# Patient Record
Sex: Female | Born: 1940 | Race: Black or African American | Hispanic: No | Marital: Married | Smoking: Never smoker
Health system: Southern US, Community
[De-identification: ages and names within clinical notes are randomized; demographics above are authoritative.]

## PROBLEM LIST (undated history)

## (undated) DIAGNOSIS — E669 Obesity, unspecified: Secondary | ICD-10-CM

## (undated) DIAGNOSIS — I10 Essential (primary) hypertension: Secondary | ICD-10-CM

## (undated) DIAGNOSIS — R609 Edema, unspecified: Secondary | ICD-10-CM

## (undated) DIAGNOSIS — K5790 Diverticulosis of intestine, part unspecified, without perforation or abscess without bleeding: Secondary | ICD-10-CM

## (undated) DIAGNOSIS — E785 Hyperlipidemia, unspecified: Secondary | ICD-10-CM

## (undated) DIAGNOSIS — M1990 Unspecified osteoarthritis, unspecified site: Secondary | ICD-10-CM

## (undated) DIAGNOSIS — K439 Ventral hernia without obstruction or gangrene: Secondary | ICD-10-CM

## (undated) DIAGNOSIS — G4733 Obstructive sleep apnea (adult) (pediatric): Secondary | ICD-10-CM

## (undated) DIAGNOSIS — M25552 Pain in left hip: Secondary | ICD-10-CM

## (undated) HISTORY — DX: Hyperlipidemia, unspecified: E78.5

## (undated) HISTORY — DX: Obesity, unspecified: E66.9

## (undated) HISTORY — DX: Ventral hernia without obstruction or gangrene: K43.9

## (undated) HISTORY — DX: Pain in left hip: M25.552

## (undated) HISTORY — DX: Obstructive sleep apnea (adult) (pediatric): G47.33

## (undated) HISTORY — DX: Diverticulosis of intestine, part unspecified, without perforation or abscess without bleeding: K57.90

## (undated) HISTORY — DX: Unspecified osteoarthritis, unspecified site: M19.90

## (undated) HISTORY — DX: Essential (primary) hypertension: I10

---

## 2011-06-23 ENCOUNTER — Other Ambulatory Visit: Payer: Self-pay | Admitting: Obstetrics and Gynecology

## 2011-06-23 DIAGNOSIS — Z1231 Encounter for screening mammogram for malignant neoplasm of breast: Secondary | ICD-10-CM

## 2011-08-04 ENCOUNTER — Ambulatory Visit
Admission: RE | Admit: 2011-08-04 | Discharge: 2011-08-04 | Disposition: A | Discharge status: Home / Self Care | Payer: Medicare Other | Source: Ambulatory Visit | Attending: Obstetrics and Gynecology | Admitting: Obstetrics and Gynecology

## 2011-08-04 DIAGNOSIS — Z1231 Encounter for screening mammogram for malignant neoplasm of breast: Secondary | ICD-10-CM

## 2011-08-07 ENCOUNTER — Other Ambulatory Visit: Payer: Self-pay | Admitting: Obstetrics and Gynecology

## 2011-08-07 DIAGNOSIS — R928 Other abnormal and inconclusive findings on diagnostic imaging of breast: Secondary | ICD-10-CM

## 2011-08-17 ENCOUNTER — Encounter: Payer: Self-pay | Admitting: Internal Medicine

## 2011-08-20 ENCOUNTER — Ambulatory Visit
Admission: RE | Admit: 2011-08-20 | Discharge: 2011-08-20 | Disposition: A | Discharge status: Home / Self Care | Payer: Medicare Other | Source: Ambulatory Visit | Attending: Obstetrics and Gynecology | Admitting: Obstetrics and Gynecology

## 2011-08-20 DIAGNOSIS — R928 Other abnormal and inconclusive findings on diagnostic imaging of breast: Secondary | ICD-10-CM

## 2011-09-01 ENCOUNTER — Ambulatory Visit (AMBULATORY_SURGERY_CENTER): Payer: Medicare Other

## 2011-09-01 VITALS — Ht 66.0 in | Wt 260.0 lb

## 2011-09-01 DIAGNOSIS — Z1211 Encounter for screening for malignant neoplasm of colon: Secondary | ICD-10-CM

## 2011-09-01 MED ORDER — PEG-KCL-NACL-NASULF-NA ASC-C 100 G PO SOLR: 1.0000 | Freq: Once | ORAL | Status: AC

## 2011-09-07 ENCOUNTER — Telehealth: Payer: Self-pay | Admitting: Internal Medicine

## 2011-09-07 NOTE — Telephone Encounter (Signed)
Rx for Moviprep called in to [facility].  Left message on pt's answering machine. Krystal Gutierrez

## 2011-09-15 ENCOUNTER — Encounter: Payer: Self-pay | Admitting: Internal Medicine

## 2011-09-15 ENCOUNTER — Telehealth: Payer: Self-pay | Admitting: *Deleted

## 2011-09-15 ENCOUNTER — Ambulatory Visit (AMBULATORY_SURGERY_CENTER): Payer: Medicare Other | Admitting: Internal Medicine

## 2011-09-15 ENCOUNTER — Other Ambulatory Visit (INDEPENDENT_AMBULATORY_CARE_PROVIDER_SITE_OTHER): Payer: Medicare Other

## 2011-09-15 VITALS — BP 117/55 | HR 72 | Temp 97.2°F | Resp 20 | Ht 66.0 in | Wt 260.0 lb

## 2011-09-15 DIAGNOSIS — R6889 Other general symptoms and signs: Secondary | ICD-10-CM

## 2011-09-15 DIAGNOSIS — K56609 Unspecified intestinal obstruction, unspecified as to partial versus complete obstruction: Secondary | ICD-10-CM

## 2011-09-15 DIAGNOSIS — Z1211 Encounter for screening for malignant neoplasm of colon: Secondary | ICD-10-CM

## 2011-09-15 DIAGNOSIS — R1900 Intra-abdominal and pelvic swelling, mass and lump, unspecified site: Secondary | ICD-10-CM

## 2011-09-15 DIAGNOSIS — R109 Unspecified abdominal pain: Secondary | ICD-10-CM

## 2011-09-15 LAB — COMPREHENSIVE METABOLIC PANEL
ALT: 13 U/L (ref 0–35)
Albumin: 4.3 g/dL (ref 3.5–5.2)
CO2: 23 mEq/L (ref 19–32)
Calcium: 9.6 mg/dL (ref 8.4–10.5)
Chloride: 104 mEq/L (ref 96–112)
GFR: 126.25 mL/min (ref >60.00)
Glucose, Bld: 86 mg/dL (ref 70–99)
Potassium: 3.7 mEq/L (ref 3.5–5.1)
Sodium: 139 mEq/L (ref 135–145)
Total Protein: 8 g/dL (ref 6.0–8.3)

## 2011-09-15 LAB — CBC WITH DIFFERENTIAL/PLATELET
Basophils Absolute: 0 10*3/uL (ref 0.0–0.1)
Eosinophils Relative: 2.7 % (ref 0.0–5.0)
HCT: 42.5 % (ref 36.0–46.0)
Lymphocytes Relative: 18.6 % (ref 12.0–46.0)
Lymphs Abs: 1.4 10*3/uL (ref 0.7–4.0)
Monocytes Relative: 5 % (ref 3.0–12.0)
Platelets: 258 10*3/uL (ref 150.0–400.0)
RDW: 16.5 % — ABNORMAL HIGH (ref 11.5–14.6)
WBC: 7.6 10*3/uL (ref 4.5–10.5)

## 2011-09-15 LAB — TSH: TSH: 1.33 u[IU]/mL (ref 0.35–5.50)

## 2011-09-15 LAB — PROTIME-INR: INR: 1.1 ratio — ABNORMAL HIGH (ref 0.8–1.0)

## 2011-09-15 MED ORDER — SODIUM CHLORIDE 0.9 % IV SOLN: 500.0000 mL | INTRAVENOUS | Status: DC

## 2011-09-15 NOTE — Telephone Encounter (Signed)
Patient will need labs:CBC, cmet, TSH, PT, PTT and CT abd/pelvis for incomplete colonoscopy:colon obstruction hernia vs abdominal mass per Dr. Juanda Chance. Labs in EPIC. CT scheduled with Rose at [address] on 09/22/11  9:45/10:00 AM. Contrast and instructions given to patient by [facility].

## 2011-09-15 NOTE — Op Note (Signed)
[facility] [address] [place], [place]  [address]  COLONOSCOPY PROCEDURE REPORT  PATIENT:  Krystal, Gutierrez  MR#:  [id number] BIRTHDATE:  03/26/1941, 70 yrs. old  GENDER:  female ENDOSCOPIST:  Hedwig Morton. Juanda Chance, MD REF. BY: PROCEDURE DATE:  09/15/2011 PROCEDURE:  Incomplete colonoscopy ASA CLASS:  Class II INDICATIONS:  colorectal cancer screening, average risk MEDICATIONS:   MAC sedation, administered by CRNA, propofol (Diprivan) 150 mg  DESCRIPTION OF PROCEDURE:   After the risks and benefits and of the procedure were explained, informed consent was obtained. Digital rectal exam was performed and revealed no rectal masses. The [id number] [id number] endoscope was introduced through the anus and advanced to the splenic flexure.  The quality of the prep was good, using MoviPrep.  The instrument was then slowly withdrawn as the colon was fully examined. <<PROCEDUREIMAGES>>  FINDINGS:  incomplete exam (see image4). at 70 cm, lumen closed, scope felt to be in a periumbilicam henia/mass, palpated through the abdome, scope could not pass,  Mild diverticulosis was found in the sigmoid colon (see image3, image2, and image1).  normal rectum (see image5).   Retroflexed views in the rectum revealed no abnormalities.    The scope was then withdrawn from the patient and the procedure completed.  COMPLICATIONS:  None ENDOSCOPIC IMPRESSION: 1) Incomplete exam 2) Mild diverticulosis in the sigmoid colon 3) Normal rectum suspect scope coiled in a umbilical hernia/mass around 70 cm, unable to pass the scope RECOMMENDATIONS: CT scan of the abdomen to look for a mass, then consider BE  REPEAT EXAM:  In 0 year(s) for.  ______________________________ Hedwig Morton. Juanda Chance, MD  CC:  n. eSIGNED:   Hedwig Morton. Krystal Gutierrez at 09/15/2011 11:39 AM  [name], [id number]

## 2011-09-15 NOTE — Patient Instructions (Addendum)
YOU HAD AN ENDOSCOPIC PROCEDURE TODAY AT THE [facility]: Refer to the procedure report that was given to you for any specific questions about what was found during the examination.  If the procedure report does not answer your questions, please call your gastroenterologist to clarify.  If you requested that your care partner not be given the details of your procedure findings, then the procedure report has been included in a sealed envelope for you to review at your convenience later.  YOU SHOULD EXPECT: Some feelings of bloating in the abdomen. Passage of more gas than usual.  Walking can help get rid of the air that was put into your GI tract during the procedure and reduce the bloating. If you had a lower endoscopy (such as a colonoscopy or flexible sigmoidoscopy) you may notice spotting of blood in your stool or on the toilet paper. If you underwent a bowel prep for your procedure, then you may not have a normal bowel movement for a few days.  DIET: Your first meal following the procedure should be a light meal and then it is ok to progress to your normal diet.  A half-sandwich or bowl of soup is an example of a good first meal.  Heavy or fried foods are harder to digest and may make you feel nauseous or bloated.  Likewise meals heavy in dairy and vegetables can cause extra gas to form and this can also increase the bloating.  Drink plenty of fluids but you should avoid alcoholic beverages for 24 hours.  ACTIVITY: Your care partner should take you home directly after the procedure.  You should plan to take it easy, moving slowly for the rest of the day.  You can resume normal activity the day after the procedure however you should NOT DRIVE or use heavy machinery for 24 hours (because of the sedation medicines used during the test).    SYMPTOMS TO REPORT IMMEDIATELY: A gastroenterologist can be reached at any hour.  During normal business hours, 8:30 AM to 5:00 PM Monday through Friday,  call [phone].  After hours and on weekends, please call the GI answering service at [phone] who will take a message and have the physician on call contact you.   Following lower endoscopy (colonoscopy or flexible sigmoidoscopy):  Excessive amounts of blood in the stool  Significant tenderness or worsening of abdominal pains  Swelling of the abdomen that is new, acute  Fever of 100F or higher Diverticulosis Diverticulosis is a common condition that develops when small pouches (diverticula) form in the wall of the colon. The risk of diverticulosis increases with age. It happens more often in people who eat a low-fiber diet. Most individuals with diverticulosis have no symptoms. Those individuals with symptoms usually experience abdominal pain, constipation, or loose stools (diarrhea). HOME CARE INSTRUCTIONS   Increase the amount of fiber in your diet as directed by your caregiver or dietician. This may reduce symptoms of diverticulosis.   Your caregiver may recommend taking a dietary fiber supplement.   Drink at least 6 to 8 glasses of water each day to prevent constipation.   Try not to strain when you have a bowel movement.   Your caregiver may recommend avoiding nuts and seeds to prevent complications, although this is still an uncertain benefit.   Only take over-the-counter or prescription medicines for pain, discomfort, or fever as directed by your caregiver.  FOODS WITH HIGH FIBER CONTENT INCLUDE:  Fruits. Apple, peach, pear, tangerine, raisins, prunes.  Vegetables. Brussels sprouts, asparagus, broccoli, cabbage, carrot, cauliflower, romaine lettuce, spinach, summer squash, tomato, winter squash, zucchini.   Starchy Vegetables. Baked beans, kidney beans, lima beans, split peas, lentils, potatoes (with skin).   Grains. Whole wheat bread, brown rice, bran flake cereal, plain oatmeal, white rice, shredded wheat, bran muffins.  SEEK IMMEDIATE MEDICAL CARE IF:   You  develop increasing pain or severe bloating.   You have an oral temperature above 102 F (38.9 C), not controlled by medicine.   You develop vomiting or bowel movements that are bloody or black.  Document Released: 04/09/2004 Document Revised: 03/25/2011 Document Reviewed: 12/11/2009 [facility] Patient Information 2012 [place], [place]. FOLLOW UP: If any biopsies were taken you will be contacted by phone or by letter within the next 1-3 weeks.  Call your gastroenterologist if you have not heard about the biopsies in 3 weeks.  Our staff will call the home number listed on your records the next business day following your procedure to check on you and address any questions or concerns that you may have at that time regarding the information given to you following your procedure. This is a courtesy call and so if there is no answer at the home number and we have not heard from you through the emergency physician on call, we will assume that you have returned to your regular daily activities without incident.  SIGNATURES/CONFIDENTIALITY: You and/or your care partner have signed paperwork which will be entered into your electronic medical record.  These signatures attest to the fact that that the information above on your After Visit Summary has been reviewed and is understood.  Full responsibility of the confidentiality of this discharge information lies with you and/or your care-partner.   CT Scan 09/22/2011 at 0945:  -Nothing by mouth after 0545  -Drink first bottle at 0745  -Drink second bottle at 0845  -Arrive early

## 2011-09-15 NOTE — Progress Notes (Signed)
Patient did not experience any of the following events: a burn prior to discharge; a fall within the facility; wrong site/side/patient/procedure/implant event; or a hospital transfer or hospital admission upon discharge from the facility. (G8907) Patient did not have preoperative order for IV antibiotic SSI prophylaxis. (G8918)  

## 2011-09-16 ENCOUNTER — Telehealth: Payer: Self-pay

## 2011-09-16 NOTE — Telephone Encounter (Signed)
  Follow up Call-  Call back number 09/15/2011  Post procedure Call Back phone  # [phone]  Permission to leave phone message Yes       No answer; voicemail only.  Left generic message with RN's name and contact # for [place].

## 2011-09-18 ENCOUNTER — Other Ambulatory Visit: Payer: Medicare Other

## 2011-09-22 ENCOUNTER — Ambulatory Visit (INDEPENDENT_AMBULATORY_CARE_PROVIDER_SITE_OTHER)
Admission: RE | Admit: 2011-09-22 | Discharge: 2011-09-22 | Disposition: A | Discharge status: Home / Self Care | Payer: Medicare Other | Source: Ambulatory Visit | Attending: Internal Medicine | Admitting: Internal Medicine

## 2011-09-22 DIAGNOSIS — R109 Unspecified abdominal pain: Secondary | ICD-10-CM

## 2011-09-22 MED ORDER — IOHEXOL 300 MG/ML  SOLN
100.0000 mL | Freq: Once | INTRAMUSCULAR | Status: AC | PRN
  Administered 2011-09-22: 100 mL via INTRAVENOUS

## 2011-10-14 ENCOUNTER — Encounter (HOSPITAL_COMMUNITY): Payer: Self-pay | Admitting: Pharmacy Technician

## 2011-10-15 NOTE — H&P (Signed)
Krystal Gutierrez is an 71 y.o. female.    Chief Complaint: left hip OA and pain   HPI: Pt is a 71 y.o. female complaining of left hip pain for 1-2 years. Pain had continually increased since the beginning. X-rays in the clinic show end-stage arthritic changes of the left hip. Pt has tried various conservative treatments which have failed to alleviate their symptoms, including multiple NSAIDs.. Various options are discussed with the patient. Risks, benefits and expectations were discussed with the patient. Patient understand the risks, benefits and expectations and wishes to proceed with surgery.   PCP:  No primary provider on file.  D/C Plans:  SNF - [facility]  Post-op Meds:  No Rx given  Tranexamic Acid:   To be given  Decadron:   To be given  PMH: Past Medical History  Diagnosis Date  . Hip pain, left   . Apnea, sleep   . Hyperlipidemia   . Hypertension   . Arthritis     PSH: No past surgical history on file.  Social History:  reports that she has never smoked. She has never used smokeless tobacco. She reports that she does not drink alcohol or use illicit drugs.  Allergies:  No Known Allergies  Medications: No current facility-administered medications for this encounter.   Current Outpatient Prescriptions  Medication Sig Dispense Refill  . aspirin 81 MG tablet Take 81 mg by mouth every morning.       . Cholecalciferol (VITAMIN D3) 3000 UNITS TABS Take 3,000 Units by mouth every morning. Take 1000 mg daily      . fish oil-omega-3 fatty acids 1000 MG capsule Take 1 g by mouth every morning.       Marland Kitchen HYDROcodone-acetaminophen (NORCO) 7.5-325 MG per tablet Take 1 tablet by mouth every 6 (six) hours as needed. For pain       . ibuprofen (ADVIL,MOTRIN) 200 MG tablet Take 200 mg by mouth every 6 (six) hours as needed. For pain      . metoprolol succinate (TOPROL-XL) 100 MG 24 hr tablet Take 100 mg by mouth every morning. Take with or immediately following a meal.      . Multiple  Vitamins-Minerals (PX SENIOR VITAMIN PO) Take 1 tablet by mouth every morning.       Marland Kitchen NIFEdipine (ADALAT CC) 90 MG 24 hr tablet Take 90 mg by mouth every morning.       . pravastatin (PRAVACHOL) 40 MG tablet Take 40 mg by mouth every morning.         ROS: Review of Systems  Constitutional: Negative.   HENT: Negative.   Eyes: Negative.   Respiratory: Negative.   Cardiovascular: Negative.   Gastrointestinal: Negative.   Genitourinary: Positive for urgency.  Musculoskeletal: Positive for joint pain.  Skin: Negative.   Neurological: Negative.   Endo/Heme/Allergies: Negative.   Psychiatric/Behavioral: Negative.      Physical Exam: BP: 124/74 ; HR: 92 ; Resp: 18 ; Physical Exam  Constitutional: She is oriented to person, place, and time and well-developed, well-nourished, and in no distress.  HENT:  Head: Normocephalic and atraumatic.  Nose: Nose normal.  Mouth/Throat: Oropharynx is clear and moist.  Eyes: Pupils are equal, round, and reactive to light.  Neck: Neck supple. No JVD present. No tracheal deviation present. No thyromegaly present.  Cardiovascular: Normal rate, regular rhythm and intact distal pulses.   Murmur heard. Pulmonary/Chest: Effort normal and breath sounds normal. No stridor.  Abdominal: Soft. There is no tenderness. There is no guarding.  Musculoskeletal:       Left hip: She exhibits decreased range of motion, decreased strength, tenderness and bony tenderness. She exhibits no swelling, no crepitus, no deformity and no laceration.  Lymphadenopathy:    She has no cervical adenopathy.  Neurological: She is alert and oriented to person, place, and time.  Skin: Skin is warm and dry.  Psychiatric: Affect normal.     Assessment/Plan Assessment: left hip OA and pain   Plan: Patient will undergo a left total hip arthroplasty on 10/27/2011 per Dr. Charlann Boxer at [facility]. Risks benefits and expectation were discussed with the patient. Patient understand  risks, benefits and expectation and wishes to proceed.   Anastasio Auerbach Krystal Gutierrez   PAC  10/15/2011, 5:55 PM

## 2011-10-20 ENCOUNTER — Ambulatory Visit (HOSPITAL_COMMUNITY)
Admission: RE | Admit: 2011-10-20 | Discharge: 2011-10-20 | Disposition: A | Discharge status: Home / Self Care | Payer: Medicare Other | Source: Ambulatory Visit | Attending: Orthopedic Surgery | Admitting: Orthopedic Surgery

## 2011-10-20 ENCOUNTER — Encounter (HOSPITAL_COMMUNITY)
Admission: RE | Admit: 2011-10-20 | Discharge: 2011-10-20 | Disposition: A | Discharge status: Home / Self Care | Payer: Medicare Other | Source: Ambulatory Visit | Attending: Orthopedic Surgery | Admitting: Orthopedic Surgery

## 2011-10-20 ENCOUNTER — Encounter (HOSPITAL_COMMUNITY): Payer: Self-pay

## 2011-10-20 DIAGNOSIS — Z01812 Encounter for preprocedural laboratory examination: Secondary | ICD-10-CM | POA: Insufficient documentation

## 2011-10-20 DIAGNOSIS — Z01818 Encounter for other preprocedural examination: Secondary | ICD-10-CM | POA: Insufficient documentation

## 2011-10-20 HISTORY — DX: Edema, unspecified: R60.9

## 2011-10-20 LAB — URINALYSIS, ROUTINE W REFLEX MICROSCOPIC
Bilirubin Urine: NEGATIVE
Hgb urine dipstick: NEGATIVE
Ketones, ur: NEGATIVE mg/dL
Protein, ur: NEGATIVE mg/dL
Specific Gravity, Urine: 1.022 (ref 1.005–1.030)
Urobilinogen, UA: 0.2 mg/dL (ref 0.0–1.0)

## 2011-10-20 LAB — CBC
MCH: 27.7 pg (ref 26.0–34.0)
MCHC: 32.8 g/dL (ref 30.0–36.0)
MCV: 84.5 fL (ref 78.0–100.0)
Platelets: 259 10*3/uL (ref 150–400)
RDW: 15.8 % — ABNORMAL HIGH (ref 11.5–15.5)

## 2011-10-20 LAB — URINE MICROSCOPIC-ADD ON

## 2011-10-20 LAB — DIFFERENTIAL
Basophils Absolute: 0.1 10*3/uL (ref 0.0–0.1)
Basophils Relative: 1 % (ref 0–1)
Eosinophils Absolute: 0.3 10*3/uL (ref 0.0–0.7)
Eosinophils Relative: 4 % (ref 0–5)
Neutrophils Relative %: 63 % (ref 43–77)

## 2011-10-20 LAB — BASIC METABOLIC PANEL
BUN: 10 mg/dL (ref 6–23)
Chloride: 99 mEq/L (ref 96–112)
GFR calc Af Amer: >90 mL/min (ref >90)
Potassium: 4.3 mEq/L (ref 3.5–5.1)
Sodium: 135 mEq/L (ref 135–145)

## 2011-10-20 LAB — SURGICAL PCR SCREEN: Staphylococcus aureus: NEGATIVE

## 2011-10-20 LAB — PROTIME-INR: Prothrombin Time: 13.5 seconds (ref 11.6–15.2)

## 2011-10-20 MED ORDER — CHLORHEXIDINE GLUCONATE 4 % EX LIQD
60.0000 mL | Freq: Once | CUTANEOUS | Status: DC
  Filled 2011-10-20: qty 60

## 2011-10-20 NOTE — Patient Instructions (Signed)
[address]  10/20/2011   Your procedure is scheduled on:  10/27/11    Tuesday   Surgery 1030-1150  Report to [facility] at   0800    AM.  Call this number if you have problems the morning of surgery: [phone]     Or PST   [id number]  Krystal Gutierrez               BRING CPAP MASK WITH YOU TO HOSPITAL  Remember:   Do not eat food:After Midnight. Monday NIGHT  May have clear liquids: until MIDNIGHT Monday NIGHT  Clear liquids include soda, tea, black coffee, apple or grape juice, broth.  Take these medicines the morning of surgery with A SIP OF WATER: TOPROL, NIFEDIPINE           NORCO IF NEEDED           Do not wear jewelry, make-up or nail polish.  Do not wear lotions, powders, or perfumes. You may wear deodorant.  Do not shave 48 hours prior to surgery.  Do not bring valuables to the hospital.  Contacts, dentures or bridgework may not be worn into surgery.  Leave suitcase in the car. After surgery it may be brought to your room.  For patients admitted to the hospital, checkout time is 11:00 AM the day of discharge.   Patients discharged the day of surgery will not be allowed to drive home.  Name and phone number of your driver:    [facility]                                                                  Special Instructions: CHG Shower Use Special Wash: 1/2 bottle night before surgery and 1/2 bottle morning of surgery. REGULAR SOAP FACE AND PRIVATES              LADIES- NO SHAVING 48 HOURS BEFORE USING BETASEPT SOAP.                 Please read over the following fact sheets that you were given: MRSA Information

## 2011-10-20 NOTE — Pre-Procedure Instructions (Signed)
Spoke with Dr Tiffany Kocher re EKG from 10/16/11. Informed him she saw Dr Mayford Knife for sleep apnea but there is not a cardiac clearance. Natasha Mead from [facility] verified she received labs with EKG from my fax- informed her Dr Acey Lav wants medical clearance pre op

## 2011-10-20 NOTE — Pre-Procedure Instructions (Signed)
Faxed labs, chest xray report with EKG from 10/17/11(Dr Turner) to Natasha Mead at [facility] at her request with confirmation- for reference re clearance PCP for surgery at 1510.    Notified Olegario Messier at [employer] of A BNORMAL     PTT, URINE  WITH MICRO- STATES WILL SEND INTRAOFFICE EMAIL NOW TO PROVIDER TO CHECK LABS 1525

## 2011-10-29 ENCOUNTER — Encounter (HOSPITAL_COMMUNITY): Payer: Self-pay | Admitting: *Deleted

## 2011-10-30 ENCOUNTER — Encounter (HOSPITAL_COMMUNITY): Payer: Self-pay

## 2011-10-30 NOTE — Pre-Procedure Instructions (Signed)
Instructed Mrs Hay in new surgery date- to arrive at 0800 Short Stay,   NPO after midnight and to tak TOPROL and Niphedipine AM of OR.  Also discussed with daughter  Jenean Lindau

## 2011-11-09 NOTE — Pre-Procedure Instructions (Signed)
Pt. Notified of time change for surgery -instructed to be at Short Stay at [facility] at 0515am.

## 2011-11-10 ENCOUNTER — Inpatient Hospital Stay (HOSPITAL_COMMUNITY)
Admission: RE | Admit: 2011-11-10 | Discharge: 2011-11-13 | DRG: 470 | Disposition: A | Discharge status: Skilled Nursing Facility | Payer: Medicare Other | Source: Ambulatory Visit | Attending: Orthopedic Surgery | Admitting: Orthopedic Surgery

## 2011-11-10 ENCOUNTER — Encounter (HOSPITAL_COMMUNITY)
Admission: RE | Disposition: A | Discharge status: Skilled Nursing Facility | Payer: Self-pay | Source: Ambulatory Visit | Attending: Orthopedic Surgery

## 2011-11-10 ENCOUNTER — Encounter (HOSPITAL_COMMUNITY): Payer: Self-pay | Admitting: Anesthesiology

## 2011-11-10 ENCOUNTER — Ambulatory Visit (HOSPITAL_COMMUNITY): Payer: Medicare Other | Admitting: Anesthesiology

## 2011-11-10 ENCOUNTER — Encounter (HOSPITAL_COMMUNITY): Payer: Self-pay

## 2011-11-10 ENCOUNTER — Ambulatory Visit (HOSPITAL_COMMUNITY): Payer: Medicare Other

## 2011-11-10 DIAGNOSIS — E785 Hyperlipidemia, unspecified: Secondary | ICD-10-CM | POA: Diagnosis present

## 2011-11-10 DIAGNOSIS — Z96649 Presence of unspecified artificial hip joint: Secondary | ICD-10-CM

## 2011-11-10 DIAGNOSIS — Z6841 Body Mass Index (BMI) 40.0 and over, adult: Secondary | ICD-10-CM

## 2011-11-10 DIAGNOSIS — G4730 Sleep apnea, unspecified: Secondary | ICD-10-CM | POA: Diagnosis present

## 2011-11-10 DIAGNOSIS — I10 Essential (primary) hypertension: Secondary | ICD-10-CM | POA: Diagnosis present

## 2011-11-10 DIAGNOSIS — M1610 Unilateral primary osteoarthritis, unspecified hip: Principal | ICD-10-CM | POA: Diagnosis present

## 2011-11-10 DIAGNOSIS — M169 Osteoarthritis of hip, unspecified: Principal | ICD-10-CM | POA: Diagnosis present

## 2011-11-10 HISTORY — PX: TOTAL HIP ARTHROPLASTY: SHX124

## 2011-11-10 LAB — TYPE AND SCREEN: ABO/RH(D): A POS

## 2011-11-10 SURGERY — ARTHROPLASTY, HIP, TOTAL,POSTERIOR APPROACH
Anesthesia: General | Site: Hip | Laterality: Left | Wound class: Clean

## 2011-11-10 MED ORDER — RIVAROXABAN 10 MG PO TABS
10.0000 mg | ORAL_TABLET | Freq: Every day | ORAL | Status: DC
  Administered 2011-11-11 – 2011-11-13 (×3): 10 mg via ORAL
  Filled 2011-11-10 (×3): qty 1

## 2011-11-10 MED ORDER — GLYCOPYRROLATE 0.2 MG/ML IJ SOLN
INTRAMUSCULAR | Status: DC | PRN
  Administered 2011-11-10: .7 mg via INTRAVENOUS

## 2011-11-10 MED ORDER — FENTANYL CITRATE 0.05 MG/ML IJ SOLN
INTRAMUSCULAR | Status: DC | PRN
  Administered 2011-11-10 (×5): 50 ug via INTRAVENOUS
  Administered 2011-11-10: 100 ug via INTRAVENOUS

## 2011-11-10 MED ORDER — MENTHOL 3 MG MT LOZG
1.0000 | LOZENGE | OROMUCOSAL | Status: DC | PRN
  Filled 2011-11-10: qty 9

## 2011-11-10 MED ORDER — DIPHENHYDRAMINE HCL 12.5 MG/5ML PO ELIX: 25.0000 mg | ORAL_SOLUTION | Freq: Four times a day (QID) | ORAL | Status: DC | PRN

## 2011-11-10 MED ORDER — ONDANSETRON HCL 4 MG PO TABS: 4.0000 mg | ORAL_TABLET | Freq: Four times a day (QID) | ORAL | Status: DC | PRN

## 2011-11-10 MED ORDER — DEXAMETHASONE SODIUM PHOSPHATE 10 MG/ML IJ SOLN: 10.0000 mg | Freq: Once | INTRAMUSCULAR | Status: DC

## 2011-11-10 MED ORDER — SODIUM CHLORIDE 0.9 % IV SOLN
INTRAVENOUS | Status: DC
  Administered 2011-11-10 – 2011-11-12 (×3): via INTRAVENOUS
  Filled 2011-11-10 (×13): qty 1000

## 2011-11-10 MED ORDER — HYDROMORPHONE HCL PF 1 MG/ML IJ SOLN: 0.2000 mg | INTRAMUSCULAR | Status: DC | PRN

## 2011-11-10 MED ORDER — NIFEDIPINE ER OSMOTIC RELEASE 90 MG PO TB24
90.0000 mg | ORAL_TABLET | Freq: Every day | ORAL | Status: DC
Start: 2011-11-11 — End: 2011-11-13
  Administered 2011-11-11 – 2011-11-13 (×3): 90 mg via ORAL
  Filled 2011-11-10 (×3): qty 1

## 2011-11-10 MED ORDER — ONDANSETRON HCL 4 MG/2ML IJ SOLN: 4.0000 mg | Freq: Four times a day (QID) | INTRAMUSCULAR | Status: DC | PRN

## 2011-11-10 MED ORDER — NEOSTIGMINE METHYLSULFATE 1 MG/ML IJ SOLN
INTRAMUSCULAR | Status: DC | PRN
  Administered 2011-11-10: 4 mg via INTRAVENOUS

## 2011-11-10 MED ORDER — METOPROLOL SUCCINATE ER 100 MG PO TB24
100.0000 mg | ORAL_TABLET | Freq: Every day | ORAL | Status: DC
  Administered 2011-11-11 – 2011-11-13 (×3): 100 mg via ORAL
  Filled 2011-11-10 (×3): qty 1

## 2011-11-10 MED ORDER — CEFAZOLIN SODIUM-DEXTROSE 2-3 GM-% IV SOLR
2.0000 g | Freq: Once | INTRAVENOUS | Status: AC
  Administered 2011-11-10: 2 g via INTRAVENOUS

## 2011-11-10 MED ORDER — METHOCARBAMOL 500 MG PO TABS
500.0000 mg | ORAL_TABLET | Freq: Four times a day (QID) | ORAL | Status: DC | PRN
  Administered 2011-11-11 – 2011-11-12 (×3): 500 mg via ORAL
  Filled 2011-11-10 (×3): qty 1

## 2011-11-10 MED ORDER — ONDANSETRON HCL 4 MG/2ML IJ SOLN
INTRAMUSCULAR | Status: DC | PRN
  Administered 2011-11-10: 4 mg via INTRAVENOUS

## 2011-11-10 MED ORDER — LACTATED RINGERS IV SOLN
INTRAVENOUS | Status: DC | PRN
  Administered 2011-11-10 (×2): via INTRAVENOUS

## 2011-11-10 MED ORDER — TRANEXAMIC ACID 100 MG/ML IV SOLN
1680.0000 mg | Freq: Once | INTRAVENOUS | Status: AC
  Administered 2011-11-10: 1680 mg via INTRAVENOUS
  Filled 2011-11-10: qty 16.8

## 2011-11-10 MED ORDER — FERROUS SULFATE 325 (65 FE) MG PO TABS
325.0000 mg | ORAL_TABLET | Freq: Three times a day (TID) | ORAL | Status: DC
  Administered 2011-11-10 – 2011-11-13 (×10): 325 mg via ORAL
  Filled 2011-11-10 (×11): qty 1

## 2011-11-10 MED ORDER — DEXAMETHASONE SODIUM PHOSPHATE 10 MG/ML IJ SOLN
INTRAMUSCULAR | Status: DC | PRN
  Administered 2011-11-10: 10 mg via INTRAVENOUS

## 2011-11-10 MED ORDER — HYDROMORPHONE HCL PF 1 MG/ML IJ SOLN
INTRAMUSCULAR | Status: AC
  Filled 2011-11-10: qty 1

## 2011-11-10 MED ORDER — METHOCARBAMOL 100 MG/ML IJ SOLN
500.0000 mg | Freq: Four times a day (QID) | INTRAVENOUS | Status: DC | PRN
  Administered 2011-11-10: 500 mg via INTRAVENOUS
  Filled 2011-11-10 (×2): qty 5

## 2011-11-10 MED ORDER — NIFEDIPINE ER OSMOTIC RELEASE 90 MG PO TB24: 90.0000 mg | ORAL_TABLET | ORAL | Status: DC

## 2011-11-10 MED ORDER — HYDROCODONE-ACETAMINOPHEN 7.5-325 MG PO TABS
1.0000 | ORAL_TABLET | ORAL | Status: DC
  Administered 2011-11-10 – 2011-11-11 (×4): 2 via ORAL
  Administered 2011-11-11: 1 via ORAL
  Administered 2011-11-11 (×2): 2 via ORAL
  Administered 2011-11-11: 1 via ORAL
  Administered 2011-11-11 – 2011-11-12 (×2): 2 via ORAL
  Administered 2011-11-12: 1 via ORAL
  Administered 2011-11-12: 2 via ORAL
  Administered 2011-11-12: 1 via ORAL
  Administered 2011-11-12: 2 via ORAL
  Administered 2011-11-12: 1 via ORAL
  Administered 2011-11-13 (×3): 2 via ORAL
  Filled 2011-11-10: qty 1
  Filled 2011-11-10 (×5): qty 2
  Filled 2011-11-10: qty 1
  Filled 2011-11-10 (×2): qty 2
  Filled 2011-11-10 (×4): qty 1
  Filled 2011-11-10 (×4): qty 2
  Filled 2011-11-10: qty 1
  Filled 2011-11-10: qty 2

## 2011-11-10 MED ORDER — LIDOCAINE HCL (CARDIAC) 20 MG/ML IV SOLN
INTRAVENOUS | Status: DC | PRN
  Administered 2011-11-10: 50 mg via INTRAVENOUS

## 2011-11-10 MED ORDER — ROCURONIUM BROMIDE 100 MG/10ML IV SOLN
INTRAVENOUS | Status: DC | PRN
  Administered 2011-11-10: 10 mg via INTRAVENOUS
  Administered 2011-11-10: 40 mg via INTRAVENOUS

## 2011-11-10 MED ORDER — ZOLPIDEM TARTRATE 5 MG PO TABS: 5.0000 mg | ORAL_TABLET | Freq: Every evening | ORAL | Status: DC | PRN

## 2011-11-10 MED ORDER — HYDROMORPHONE HCL PF 1 MG/ML IJ SOLN
0.2500 mg | INTRAMUSCULAR | Status: DC | PRN
  Administered 2011-11-10 (×4): 0.5 mg via INTRAVENOUS

## 2011-11-10 MED ORDER — PHENOL 1.4 % MT LIQD
1.0000 | OROMUCOSAL | Status: DC | PRN
  Filled 2011-11-10: qty 177

## 2011-11-10 MED ORDER — CEFAZOLIN SODIUM 1-5 GM-% IV SOLN
1.0000 g | Freq: Four times a day (QID) | INTRAVENOUS | Status: AC
  Administered 2011-11-10 – 2011-11-11 (×3): 1 g via INTRAVENOUS
  Filled 2011-11-10 (×3): qty 50

## 2011-11-10 MED ORDER — LACTATED RINGERS IV SOLN: INTRAVENOUS | Status: DC

## 2011-11-10 MED ORDER — ACETAMINOPHEN 10 MG/ML IV SOLN
INTRAVENOUS | Status: DC | PRN
  Administered 2011-11-10: 1000 mg via INTRAVENOUS

## 2011-11-10 MED ORDER — ACETAMINOPHEN 10 MG/ML IV SOLN
INTRAVENOUS | Status: AC
  Filled 2011-11-10: qty 100

## 2011-11-10 MED ORDER — PROPOFOL 10 MG/ML IV BOLUS
INTRAVENOUS | Status: DC | PRN
  Administered 2011-11-10: 200 mg via INTRAVENOUS

## 2011-11-10 MED ORDER — SUCCINYLCHOLINE CHLORIDE 20 MG/ML IJ SOLN
INTRAMUSCULAR | Status: DC | PRN
  Administered 2011-11-10: 160 mg via INTRAVENOUS

## 2011-11-10 MED ORDER — ALUM & MAG HYDROXIDE-SIMETH 200-200-20 MG/5ML PO SUSP: 30.0000 mL | ORAL | Status: DC | PRN

## 2011-11-10 MED ORDER — CEFAZOLIN SODIUM-DEXTROSE 2-3 GM-% IV SOLR
INTRAVENOUS | Status: AC
  Filled 2011-11-10: qty 50

## 2011-11-10 MED ORDER — SODIUM CHLORIDE 0.9 % IR SOLN
Status: DC | PRN
  Administered 2011-11-10: 1000 mL

## 2011-11-10 SURGICAL SUPPLY — 44 items
BAG ZIPLOCK 12X15 (MISCELLANEOUS) ×2 IMPLANT
BLADE SAW SGTL 18X1.27X75 (BLADE) ×2 IMPLANT
BNDG COHESIVE 6X5 TAN STRL LF (GAUZE/BANDAGES/DRESSINGS) ×2 IMPLANT
CLOTH BEACON ORANGE TIMEOUT ST (SAFETY) ×2 IMPLANT
DERMABOND ADVANCED (GAUZE/BANDAGES/DRESSINGS) ×1
DERMABOND ADVANCED .7 DNX12 (GAUZE/BANDAGES/DRESSINGS) ×1 IMPLANT
DRAPE INCISE IOBAN 85X60 (DRAPES) ×2 IMPLANT
DRAPE ORTHO SPLIT 77X108 STRL (DRAPES) ×2
DRAPE POUCH INSTRU U-SHP 10X18 (DRAPES) ×2 IMPLANT
DRAPE SURG 17X11 SM STRL (DRAPES) ×2 IMPLANT
DRAPE SURG ORHT 6 SPLT 77X108 (DRAPES) ×2 IMPLANT
DRAPE U-SHAPE 47X51 STRL (DRAPES) ×2 IMPLANT
DRSG AQUACEL AG ADV 3.5X10 (GAUZE/BANDAGES/DRESSINGS) ×2 IMPLANT
DRSG TEGADERM 4X4.75 (GAUZE/BANDAGES/DRESSINGS) ×2 IMPLANT
DURAPREP 26ML APPLICATOR (WOUND CARE) ×2 IMPLANT
ELECT BLADE TIP CTD 4 INCH (ELECTRODE) ×2 IMPLANT
ELECT REM PT RETURN 9FT ADLT (ELECTROSURGICAL) ×2
ELECTRODE REM PT RTRN 9FT ADLT (ELECTROSURGICAL) ×1 IMPLANT
EVACUATOR 1/8 PVC DRAIN (DRAIN) ×2 IMPLANT
FACESHIELD LNG OPTICON STERILE (SAFETY) ×8 IMPLANT
GAUZE SPONGE 2X2 8PLY STRL LF (GAUZE/BANDAGES/DRESSINGS) ×1 IMPLANT
GLOVE BIOGEL PI IND STRL 7.5 (GLOVE) ×1 IMPLANT
GLOVE BIOGEL PI IND STRL 8 (GLOVE) ×1 IMPLANT
GLOVE BIOGEL PI INDICATOR 7.5 (GLOVE) ×1
GLOVE BIOGEL PI INDICATOR 8 (GLOVE) ×1
GLOVE ECLIPSE 8.0 STRL XLNG CF (GLOVE) ×2 IMPLANT
GLOVE ORTHO TXT STRL SZ7.5 (GLOVE) ×4 IMPLANT
GOWN BRE IMP PREV XXLGXLNG (GOWN DISPOSABLE) ×4 IMPLANT
GOWN STRL NON-REIN LRG LVL3 (GOWN DISPOSABLE) ×2 IMPLANT
KIT BASIN OR (CUSTOM PROCEDURE TRAY) ×2 IMPLANT
MANIFOLD NEPTUNE II (INSTRUMENTS) ×2 IMPLANT
NS IRRIG 1000ML POUR BTL (IV SOLUTION) ×2 IMPLANT
PACK TOTAL JOINT (CUSTOM PROCEDURE TRAY) ×2 IMPLANT
POSITIONER SURGICAL ARM (MISCELLANEOUS) ×2 IMPLANT
SPONGE GAUZE 2X2 STER 10/PKG (GAUZE/BANDAGES/DRESSINGS) ×1
SUCTION FRAZIER TIP 10 FR DISP (SUCTIONS) ×2 IMPLANT
SUT MNCRL AB 4-0 PS2 18 (SUTURE) ×2 IMPLANT
SUT VIC AB 1 CT1 36 (SUTURE) ×8 IMPLANT
SUT VIC AB 2-0 CT1 27 (SUTURE) ×2
SUT VIC AB 2-0 CT1 TAPERPNT 27 (SUTURE) ×2 IMPLANT
SUT VLOC 180 0 24IN GS25 (SUTURE) ×2 IMPLANT
TOWEL OR 17X26 10 PK STRL BLUE (TOWEL DISPOSABLE) ×4 IMPLANT
TRAY FOLEY CATH 14FRSI W/METER (CATHETERS) ×2 IMPLANT
WATER STERILE IRR 1500ML POUR (IV SOLUTION) ×2 IMPLANT

## 2011-11-10 NOTE — Anesthesia Postprocedure Evaluation (Signed)
  Anesthesia Post-op Note  Patient: Krystal Gutierrez  Procedure(s) Performed: Procedure(s) (LRB): TOTAL HIP ARTHROPLASTY (Left)  Patient Location: PACU  Anesthesia Type: General  Level of Consciousness: oriented and sedated  Airway and Oxygen Therapy: Patient Spontanous Breathing and Patient connected to nasal cannula oxygen  Post-op Pain: mild  Post-op Assessment: Post-op Vital signs reviewed, Patient's Cardiovascular Status Stable, Respiratory Function Stable and Patent Airway  Post-op Vital Signs: stable  Complications: No apparent anesthesia complications

## 2011-11-10 NOTE — Anesthesia Preprocedure Evaluation (Addendum)
Anesthesia Evaluation  Patient identified by MRN, date of birth, ID band Patient awake    Reviewed: Allergy & Precautions, H&P , NPO status , Patient's Chart, lab work & pertinent test results, reviewed documented beta blocker date and time   Airway Mallampati: II TM Distance: >3 FB Neck ROM: Full    Dental  (+) Teeth Intact and Dental Advisory Given   Pulmonary sleep apnea and Continuous Positive Airway Pressure Ventilation ,  breath sounds clear to auscultation        Cardiovascular hypertension, Pt. on medications Rhythm:Regular Rate:Normal  Denies cardiac symptoms Given CV clearance   Neuro/Psych negative neurological ROS  negative psych ROS   GI/Hepatic negative GI ROS, Neg liver ROS,   Endo/Other  negative endocrine ROS  Renal/GU negative Renal ROS  negative genitourinary   Musculoskeletal   Abdominal   Peds negative pediatric ROS (+)  Hematology negative hematology ROS (+)   Anesthesia Other Findings   Reproductive/Obstetrics negative OB ROS                          Anesthesia Physical Anesthesia Plan  ASA: III  Anesthesia Plan: General   Post-op Pain Management:    Induction: Intravenous  Airway Management Planned: Oral ETT  Additional Equipment:   Intra-op Plan:   Post-operative Plan: Extubation in OR  Informed Consent: I have reviewed the patients History and Physical, chart, labs and discussed the procedure including the risks, benefits and alternatives for the proposed anesthesia with the patient or authorized representative who has indicated his/her understanding and acceptance.   Dental advisory given  Plan Discussed with: CRNA and Surgeon  Anesthesia Plan Comments:         Anesthesia Quick Evaluation

## 2011-11-10 NOTE — Transfer of Care (Signed)
Immediate Anesthesia Transfer of Care Note  Patient: Krystal Gutierrez  Procedure(s) Performed: Procedure(s) (LRB): TOTAL HIP ARTHROPLASTY (Left)  Patient Location: PACU  Anesthesia Type: General  Level of Consciousness: sedated, patient cooperative and responds to stimulaton  Airway & Oxygen Therapy: Patient Spontanous Breathing and Patient connected to face mask oxgen  Post-op Assessment: Report given to PACU RN and Post -op Vital signs reviewed and stable  Post vital signs: Reviewed and stable  Complications: No apparent anesthesia complications

## 2011-11-10 NOTE — Op Note (Signed)
NAME:Krystal Gutierrez [facility]  MEDICAL RECORD ZO.:[id number]   LOCATION: 1602 FACILITY: [facility]   DATE OF BIRTH: 1941/04/24  PHYSICIAN: Madlyn Frankel. Charlann Boxer, M.D.    DATE OF PROCEDURE: 11/10/2011     OPERATIVE REPORT:   PREOPERATIVE DIAGNOSIS: Left hip osteoarthritis.   POSTOPERATIVE DIAGNOSIS: Left hip osteoarthritis. 2. Morbid obesity  PROCEDURE: Left total hip replacement utilizing DePuy components size  52mm Pinnacle cup, 36+4 neutral Altrx liner, a size 5 Standard Tri-Lock stem  with a 36+5 delta ceramic ball.   SURGEON: Madlyn Frankel. Charlann Boxer, M.D.   ASSISTANT: Leilani Able, PA-C.  Please note that physician assistant, Leilani Able, was present for the entire case from  preoperative positioning to perioperative retractive management, general  facilitation and management of the operative extremity during the case  as well as general facilitation of the case and primary wound closure.   ANESTHESIA: GET   SPECIMENS: None.   COMPLICATIONS: None.   ESTIMATED BLOOD LOSS: 400cc.   DRAINS: One Hemovac.   INDICATION FOR PROCEDURE: The patient is a 71 y.o. @gender @ patient of mine  with history of left hip osteoarthritis.  The patient had progressive worsening of left hip  pain. They had failed conservative measures including medications, activity modification, and consideration of injections.  After reviewing the  risks infection, DVT, dislocation, and need for revision surgeries, consent was  obtained for benefit of pain relief.   PROCEDURE IN DETAIL: The patient was brought to the operative theater.  Once adequate anesthesia, preop antibiotics, 2gm Ancef administered, the  patient was positioned in the right lateral decubitus position with the  left side up using the peg board positioner.  The left lower extremity was prepped and draped in  sterile fashion.  Time-out was performed identifying the patient,  planned procedure, and extremity.   A lateral incision was made based off the  proximal trochanter. Sharp dissection was carried down to the level of the iliotibial band and gluteal fascia.  The iliotibial band and gluteal fascia were then opened through a  posterior approach in the hip.  The posterior aspect of the hip was exposed. The short external  rotators were taken down separate from the posterior capsule which was  preserved to help protect the sciatic nerve from retractors, and also to be potentially repaired at the end of the procedure. Following this, an L capsulotomy was made referenced off the superior femoral neck.  The hip was then dislocated. A femoral neck osteotomy was made based on anatomic landmarks and pre-operative templating.  The femoral head was removed and advanced degenerative changes were noted on both the femoral head and acetabulum.   Femoral preparation was begun, retractors were placed and the proximal  femur was opened with the starting drill, I then hand reamed once and irrigated to  try to prevent fat emboli.  I began broaching with 0 broach and broached up initially to a size 5  broach. I packed off the femur with a sponge and then directed my attention to the acetabulum.  Acetabular retractors were placed. The labrum and foveal tissue was debrided. In  addition, there was a portion of the superior capsule removed to allow  for exposure of the cup.  I then began reaming with a 43mm reamer and reamed up  to a 51mm reamer with excellent bony bed preparation.  A 52mm Pinnacle cup was  chosen. The cup was subsequently impacted with good initial  scratch fit fixation.  2 screws were placed.  The hole eliminator was  placed and the final 36+4 neutral Altrx liner was impacted  with good visualized rim fit.   Please note that I did assess the cup position using the anatomic  landmarks present including acetabular rim as well as my hip guide  indicating position about 20 degrees of forward flexion and 35-40  degrees of abduction.   At this  point, I turned to the femur. The size 5 broach was placed in the femur.  Based on the pre-operative radiographs and templating, I chose a standard  offset neck. A trial reduction was now carried out with a standard offset  neck and 36+5 head. On inspection of range of motion, there was no  evidence of impingement or subluxation. Leg lengths appeared to be  equal to the contralateral leg as I compare them to the preoperative  state. Given all these findings, the trial components were removed.  The final 5 Standard Tri-Lock stem was chosen. After irrigating the femoral  canal, the final stem was impacted and sat at the level of the final broach. Based on this and the trial reduction, a 36+5 delta  ceramic ball was chosen, impacted onto a clean dry trunnion.   At this point, the hip was reduced. The hip had been irrigated  throughout the case and again at this point. We reapproximated the capsule to itself using a #1 vicryl.  I placed a medium Hemovac drain deep. The iliotibial band and gluteal  fascia were then reapproximated using #1 Vicryl. The remaining wound  was closed with 2-0 Vicryl and running 4-0 Monocryl.   The hip was cleaned, dried, and dressed sterilely using Dermabond and Aquacel  dressing. The patient's drain site dressed separately. The patient was  brought to the recovery room in stable condition, tolerating the  procedure well.     Madlyn Frankel Charlann Boxer, M.D.

## 2011-11-10 NOTE — Interval H&P Note (Signed)
History and Physical Interval Note:  11/10/2011 7:36 AM  Krystal Gutierrez  has presented today for surgery, with the diagnosis of osteoarthritis left hip  The various methods of treatment have been discussed with the patient and family. After consideration of risks, benefits and other options for treatment, the patient has consented to  Procedure(s) (LRB): LEFT TOTAL HIP ARTHROPLASTY (Left) as a surgical intervention .  The patients' history has been reviewed, patient examined, no change in status, stable for surgery.  I have reviewed the patients' chart and labs.  Questions were answered to the patient's satisfaction.     Shelda Pal

## 2011-11-11 LAB — BASIC METABOLIC PANEL
CO2: 25 mEq/L (ref 19–32)
Glucose, Bld: 139 mg/dL — ABNORMAL HIGH (ref 70–99)
Potassium: 4 mEq/L (ref 3.5–5.1)
Sodium: 131 mEq/L — ABNORMAL LOW (ref 135–145)

## 2011-11-11 LAB — CBC
HCT: 35.3 % — ABNORMAL LOW (ref 36.0–46.0)
Hemoglobin: 11.4 g/dL — ABNORMAL LOW (ref 12.0–15.0)
MCH: 27.3 pg (ref 26.0–34.0)
RBC: 4.18 MIL/uL (ref 3.87–5.11)

## 2011-11-11 NOTE — Progress Notes (Signed)
Subjective: 1 Day Post-Op Procedure(s) (LRB): TOTAL HIP ARTHROPLASTY (Left)   Patient reports pain as mild. Pain well controlled. No events throughout the night.   Objective:   VITALS:   Filed Vitals:   11/11/11 0600  BP: 126/65  Pulse: 70  Temp: 98 F (36.7 C)  Resp: 18    Neurovascular intact Dorsiflexion/Plantar flexion intact Incision: dressing C/D/I No cellulitis present Compartment soft  LABS  Basename 11/11/11 0421  HGB 11.4*  HCT 35.3*  WBC 9.9  PLT 256     Basename 11/11/11 0421  NA 131*  K 4.0  BUN 7  CREATININE 0.61  GLUCOSE 139*     Assessment/Plan: 1 Day Post-Op Procedure(s) (LRB): TOTAL HIP ARTHROPLASTY (Left)   HV drain d/c'ed Foley cath d/c'ed Advance diet Up with therapy D/C IV fluids Discharge to SNF, possibly Friday   Anastasio Auerbach. Yuto Cajuste   PAC  11/11/2011, 8:38 AM

## 2011-11-11 NOTE — Evaluation (Signed)
Occupational Therapy Evaluation Patient Details Name: Krystal Gutierrez MRN: [id number] DOB: Apr 08, 1941 Today's Date: 11/11/2011  Problem List:  Patient Active Problem List  Diagnoses  . S/P left THA    Past Medical History:  Past Medical History  Diagnosis Date  . Hip pain, left   . Hyperlipidemia   . Arthritis   . Umbilical hernia   . Edema     bilateral legs  . Apnea, sleep     12/12  SEVERE- report on chart   Dr Mayford Knife follows  . Hypertension     EKG 10/16/11- abnormal- Clearance with  OV with stress test, eccho Dr Mayford Knife 10/26/11 on chart   Past Surgical History:  Past Surgical History  Procedure Date  . No past surgeries     OT Assessment/Plan/Recommendation OT Assessment Clinical Impression Statement: Pt doing well with LTHR POD#1. Planning for st sn[facility] at d/c. Skilled OT recommended to maximize I w/BADLs to supervision/min A level in prep for d/c to next venue of care. OT Recommendation/Assessment: Patient will need skilled OT in the acute care venue OT Problem List: Decreased activity tolerance;Decreased safety awareness;Decreased knowledge of precautions;Impaired balance (sitting and/or standing);Decreased knowledge of use of DME or AE Barriers to Discharge: Inaccessible home environment;Decreased caregiver support OT Therapy Diagnosis : Generalized weakness OT Plan OT Frequency: Min 1X/week OT Treatment/Interventions: Self-care/ADL training;Therapeutic activities;DME and/or AE instruction;Patient/family education OT Recommendation Follow Up Recommendations: Skilled nursing facility Equipment Recommended: Defer to next venue Individuals Consulted Consulted and Agree with Results and Recommendations: Patient OT Goals Acute Rehab OT Goals OT Goal Formulation: With patient Time For Goal Achievement: 2 weeks ADL Goals Pt Will Perform Grooming: with supervision;Standing at sink ADL Goal: Grooming - Progress: Goal set today Pt Will Perform Lower Body Bathing: with min  assist;Sit to stand from chair;Sit to stand from bed;with adaptive equipment ADL Goal: Lower Body Bathing - Progress: Goal set today Pt Will Perform Lower Body Dressing: with min assist;Sit to stand from chair;Sit to stand from bed;with adaptive equipment ADL Goal: Lower Body Dressing - Progress: Goal set today Pt Will Transfer to Toilet: with supervision;3-in-1;Comfort height toilet;Maintaining hip precautions;Ambulation;Stand pivot transfer ADL Goal: Toilet Transfer - Progress: Goal set today Pt Will Perform Toileting - Clothing Manipulation: with supervision;Standing ADL Goal: Toileting - Clothing Manipulation - Progress: Goal set today Pt Will Perform Toileting - Hygiene: with supervision;Sit to stand from 3-in-1/toilet ADL Goal: Toileting - Hygiene - Progress: Goal set today  OT Evaluation Precautions/Restrictions  Precautions Precautions: Posterior Hip Precaution Comments: sign in room;  Restrictions Weight Bearing Restrictions: Yes LLE Weight Bearing: Partial weight bearing LLE Partial Weight Bearing Percentage or Pounds: 50% Prior Functioning Home Living Lives With: Family Available Help at Discharge: Family Type of Home: House Home Access: Stairs to enter Secretary/administrator of Steps: 1 Entrance Stairs-Rails: None Home Layout: Two level Alternate Level Stairs-Number of Steps: 6 Alternate Level Stairs-Rails: Right Bathroom Shower/Tub: Engineer, manufacturing systems: Standard Home Adaptive Equipment: Walker - rolling Additional Comments: Pt planning for rehab at [facility] Prior Function Level of Independence: Independent Comments: 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 Where Assessed - Lower Body Dressing: Sit to stand from chair Toilet Transfer: Performed;Minimal assistance Toilet Transfer Method: Ambulating Toilet Transfer Equipment: Raised toilet seat with arms (or 3-in-1 over toilet) Toileting - Clothing Manipulation: Performed;Minimal assistance Where Assessed - Toileting Clothing Manipulation: Sit to stand from 3-in-1 or toilet Toileting - Hygiene: Performed;Minimal assistance Where Assessed - Toileting Hygiene: Sit to stand from 3-in-1 or toilet Tub/Shower Transfer: Not assessed Tub/Shower Transfer Method: Not assessed Equipment Used: Rolling walker (3:1) Ambulation Related to ADLs: Pt ambulated to the bathroom with minguard A. Fatigues quickly. ADL Comments: Pt able to verbalize 2/3 hip precautions Vision/Perception    Cognition Cognition Arousal/Alertness: Awake/alert Overall Cognitive Status: Appears within functional limits for tasks assessed Orientation Level: Oriented X4 Sensation/Coordination   Extremity Assessment RUE Assessment RUE Assessment: Within Functional Limits LUE Assessment LUE Assessment: Within Functional Limits Mobility  Bed Mobility Bed Mobility: No Supine to Sit: 3: Mod assist;HOB elevated (Comment degrees) Supine to Sit Details (indicate cue type and reason): cues for technique and THP Sitting - Scoot to Edge of Bed: 4: Min assist Transfers Transfers: Yes Sit to Stand: 4: Min assist;With upper extremity assist;From chair/3-in-1;With armrests Sit to Stand Details (indicate cue type and reason): cues for hand placement and LE position. Stand to Sit: 4: Min assist;With upper extremity assist;To chair/3-in-1;With armrests Stand to Sit Details: cues for hand placement and LE  position. Exercises Total Joint Exercises Ankle Circles/Pumps: AROM;10 reps;Both End of Session OT - End of Session Activity Tolerance: Patient tolerated treatment well Patient left: in chair;with call bell in reach General Behavior During Session: [facility] for tasks performed Cognition: [facility] for tasks performed   Namiko Pritts A, OTR/L [phone] 11/11/2011, 12:04 PM

## 2011-11-11 NOTE — Progress Notes (Signed)
CARE MANAGEMENT NOTE 11/11/2011  Patient:  Krystal Gutierrez, Krystal Gutierrez   Account Number:  [id number]  Date Initiated:  11/11/2011  Documentation initiated by:  Colleen Can  Subjective/Objective Assessment:   DX OSTEOARTHRITIS LEFT HIP; TOTAL HIP REPLACEMNT     Action/Plan:   CM  SPOKE WITH PATIENT AND DAUGHTER. PLANS ARE FOR PT TO GO TO SNF REHAB   Anticipated DC Date:  11/13/2011   Anticipated DC Plan:  SKILLED NURSING FACILITY  In-house referral  Clinical Social Worker      DC Planning Services  CM consult      [facility] Choice  NA   Choice offered to / List presented to:  NA   DME arranged  NA      DME agency  NA     HH arranged  NA      HH agency  NA   Status of service:  Completed, signed off Medicare Important Message given?  NA - LOS <3 / Initial given by admissions (If response is "NO", the following Medicare IM given date fields will be blank) Per UR Regulation:  Reviewed for med. necessity/level of care/duration of stay  IComments:  CSW FOLLOWING PATIENT, CM SIGNING OFF

## 2011-11-11 NOTE — Progress Notes (Signed)
CSW met with patient. Patient is alert and oriented X4. Patient states that she is preregistered at [facility]. FL2 completed and placed on chart. CSW to follow for discharge to [facility].  Dresden Lozito C. Theia Dezeeuw MSW, LCSW [phone]

## 2011-11-11 NOTE — Evaluation (Signed)
Physical Therapy Evaluation Patient Details Name: Krystal Gutierrez MRN: [id number] DOB: July 18, 1941 Today's Date: 11/11/2011  Problem List:  Patient Active Problem List  Diagnoses  . S/P left THA    Past Medical History:  Past Medical History  Diagnosis Date  . Hip pain, left   . Hyperlipidemia   . Arthritis   . Umbilical hernia   . Edema     bilateral legs  . Apnea, sleep     12/12  SEVERE- report on chart   Dr Mayford Knife follows  . Hypertension     EKG 10/16/11- abnormal- Clearance with  OV with stress test, eccho Dr Mayford Knife 10/26/11 on chart   Past Surgical History:  Past Surgical History  Procedure Date  . No past surgeries     PT Assessment/Plan/Recommendation PT Assessment Clinical Impression Statement: pt is s/p left POSTERIOR THR and will benefit from, PT to maximize independence for next venue of care. PT Recommendation/Assessment: Patient will need skilled PT in the acute care venue PT Problem List: Decreased strength;Decreased range of motion;Decreased activity tolerance;Decreased balance;Decreased mobility;Decreased knowledge of precautions;Decreased knowledge of use of DME Barriers to Discharge: Decreased caregiver support;Other (comment) Barriers to Discharge Comments: bedroom on second level, approximately 6 steps to access PT Therapy Diagnosis : Difficulty walking PT Plan PT Frequency: 7X/week PT Treatment/Interventions: DME instruction;Gait training;Functional mobility training;Therapeutic activities;Therapeutic exercise;Patient/family education PT Recommendation Follow Up Recommendations: SNF Equipment Recommended: Defer to next venue PT Goals  Acute Rehab PT Goals PT Goal Formulation: With patient Time For Goal Achievement: 6 days Pt will go Supine/Side to Sit: with supervision PT Goal: Supine/Side to Sit - Progress: Goal set today Pt will go Sit to Supine/Side: with min assist PT Goal: Sit to Supine/Side - Progress: Goal set today Pt will go Sit to Stand:  with supervision PT Goal: Sit to Stand - Progress: Goal set today Pt will go Stand to Sit: with supervision PT Goal: Stand to Sit - Progress: Goal set today Pt will Ambulate: 51 - 150 feet;with supervision;with rolling walker PT Goal: Ambulate - Progress: Goal set today Pt will Perform Home Exercise Program: with min assist PT Goal: Perform Home Exercise Program - Progress: Goal set today  PT Evaluation Precautions/Restrictions  Precautions Precautions: Posterior Hip Precaution Comments: sign in room;  Restrictions LLE Weight Bearing: Partial weight bearing LLE Partial Weight Bearing Percentage or Pounds: 50% Prior Functioning  Home Living Lives With: Daughter Available Help at Discharge: Family Type of Home: House Home Access: Stairs to enter Secretary/administrator of Steps: 1 Entrance Stairs-Rails: None Home Layout: Two level Alternate Level Stairs-Number of Steps: 6 Alternate Level Stairs-Rails: Right Home Adaptive Equipment: Walker - rolling Additional Comments: pt planning for rehab at [facility] Prior Function Comments: 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 Cognition Cognition Arousal/Alertness: Awake/alert Overall Cognitive Status: Appears within functional limits for tasks assessed Orientation Level: Oriented X4 Sensation/Coordination   Extremity Assessment RUE Assessment RUE Assessment: Within Functional Limits LUE Assessment LUE Assessment: Within Functional Limits RLE Assessment RLE Assessment: Within Functional Limits LLE Assessment LLE Assessment:  (ankle WFL; hip and knee grossly 2+/5) Mobility (including Balance) Bed Mobility Supine to Sit: 3: Mod assist;HOB elevated (Comment degrees) Supine to Sit Details (indicate cue type and reason): cues for technique and THP Sitting - Scoot to Edge of Bed: 4: Min assist Transfers Sit to Stand: 3: Mod assist;From bed;From elevated surface Sit to Stand Details (indicate cue type and reason): cues for THP and hand  placement Stand to Sit: 4: Min assist;To  chair/3-in-1 Stand to Sit Details: cues for THP and hand placement Ambulation/Gait Ambulation/Gait Assistance: 4: Min assist Ambulation/Gait Assistance Details (indicate cue type and reason): cues for sequence, PWB, [posture and to decrease speed Ambulation Distance (Feet): 60 Feet Assistive device: Rolling walker Gait Pattern: Step-to pattern;Trunk flexed    Exercise  Total Joint Exercises Ankle Circles/Pumps: AROM;10 reps;Both End of Session PT - End of Session Equipment Utilized During Treatment: Gait belt Activity Tolerance: Patient tolerated treatment well Patient left: in chair;with call bell in reach Nurse Communication: Mobility status for ambulation;Mobility status for transfers General Behavior During Session: [facility] for tasks performed Cognition: [facility] for tasks performed  [facility] 11/11/2011, 9:46 AM

## 2011-11-11 NOTE — Progress Notes (Signed)
11/11/11 1201  PT Visit Information  Last PT Received On 11/11/11  Precautions  Precautions Posterior Hip  Precaution Comments sign in room;   Restrictions  LLE Weight Bearing PWB  LLE Partial Weight Bearing Percentage or Pounds 50%  Bed Mobility  Bed Mobility Yes  Sit to Supine 1: +2 Total assist;Patient percentage (comment);HOB flat (pt=50%)  Sit to Supine - Details (indicate cue type and reason) cues for technique and postioning; increased time for position  Transfers  Sit to Stand 4: Min assist;With upper extremity assist;From chair/3-in-1;With armrests  Sit to Stand Details (indicate cue type and reason) cues for hand placement and LE position  Stand to Sit 4: Min assist;With upper extremity assist;To bed;3: Mod assist  Stand to Sit Details cues for hand placement and LE position  Ambulation/Gait  Ambulation/Gait Assistance 4: Min assist  Ambulation/Gait Assistance Details (indicate cue type and reason) cues for sequence, PWB,   Ambulation Distance (Feet) 5 Feet  Assistive device Rolling walker  Gait Pattern Step-to pattern;Trunk flexed  Total Joint Exercises  Ankle Circles/Pumps AROM;10 reps;Both  Quad Sets AROM;10 reps;Both  Short Arc [employer];Left;10 reps  Heel Slides AAROM;Left;10 reps  Hip ABduction/ADduction AAROM;Left;10 reps  PT - End of Session  Activity Tolerance Patient tolerated treatment well  Patient left with call bell in reach;in bed  Nurse Communication Mobility status for ambulation;Mobility status for transfers  General  Behavior During Session [facility] for tasks performed  Cognition [facility] for tasks performed  PT - Assessment/Plan  Comments on Treatment Session pt progressing well  PT Plan Discharge plan remains appropriate;Frequency remains appropriate  Equipment Recommended Defer to next venue  Acute Rehab PT Goals  Pt will go Sit to Supine/Side with min assist  PT Goal: Sit to Supine/Side - Progress Progressing toward goal  Pt will go Sit to  Stand with supervision  PT Goal: Sit to Stand - Progress Progressing toward goal  Pt will go Stand to Sit with supervision  PT Goal: Stand to Sit - Progress Progressing toward goal  Pt will Ambulate 51 - 150 feet;with supervision;with rolling walker  PT Goal: Ambulate - Progress Progressing toward goal  Pt will Perform Home Exercise Program with min assist  PT Goal: Perform Home Exercise Program - Progress Progressing toward goal

## 2011-11-11 NOTE — Clinical Documentation Improvement (Signed)
     BMI DOCUMENTATION CLARIFICATION QUERY  THIS DOCUMENT IS NOT A PERMANENT PART OF THE MEDICAL RECORD  TO RESPOND TO THE THIS QUERY, FOLLOW THE INSTRUCTIONS BELOW:  1. If needed, update documentation for the patient's encounter via the notes activity.  2. Access this query again and click edit on the In [employer].  3. After updating, or not, click F2 to complete all highlighted (required) fields concerning your review. Select "additional documentation in the medical record" OR "no additional documentation provided".  4. Click Sign note button.  5. The deficiency will fall out of your In Basket *Please let us know if you are not able to complete this workflow by phone or e-mail (listed below).         11/11/11  Dear Dr. Charlann Boxer, Judie Petit Marton Redwood  In an effort to better capture your patient's severity of illness, reflect appropriate length of stay and utilization of resources, a review of the patient medical record has revealed the following indicators.    Based on your clinical judgment, please clarify and document in a progress note and/or discharge summary the clinical condition associated with the following supporting information:  In responding to this query please exercise your independent judgment.  The fact that a query is asked, does not imply that any particular answer is desired or expected.  Pt admitted for L THA.  Pt's BMI=  42 wt 252lbs in setting of OA to left hip and hip pain.  Please clarify whether or not BMI can be linked to one of he diagnoses listed below and document in pn  and d/c. Thank You!  [facility]: When linking BMI to a diagnosis please document both BMI and diagnosis together in pn for accuracy of SOI and ROM.   Possible Clinical conditions  Morbid Obesity W/ BMI=   Underweight w/BMI=  Other condition___________________  Cannot Clinically determine _____________  Risk Factors: OA left hip, L THA  Sign &  Symptoms: BMI-42 5'5"/252lbs  Treatment monitoring:    Reviewed: additional documentation in the medical record ljh  Thank You,  [employer]. Ambrose Mantle RN, BSN, CCDS Clinical Documentation Specialist [facility] HIM Dept Pager: [phone]  Health Information Management [place]

## 2011-11-12 LAB — CBC
Hemoglobin: 10.9 g/dL — ABNORMAL LOW (ref 12.0–15.0)
MCH: 27.3 pg (ref 26.0–34.0)
MCV: 84.8 fL (ref 78.0–100.0)
RBC: 4 MIL/uL (ref 3.87–5.11)

## 2011-11-12 LAB — BASIC METABOLIC PANEL
BUN: 10 mg/dL (ref 6–23)
CO2: 25 mEq/L (ref 19–32)
Calcium: 8.7 mg/dL (ref 8.4–10.5)
Creatinine, Ser: 0.61 mg/dL (ref 0.50–1.10)
Glucose, Bld: 122 mg/dL — ABNORMAL HIGH (ref 70–99)

## 2011-11-12 NOTE — Progress Notes (Signed)
11/12/11 1400  PT Visit Information  Last PT Received On 11/12/11  Precautions  Precautions Posterior Hip  Precaution Comments sign in room;  (reviewed THP, pt able to recall 1/3 and unable to call PWB )  Restrictions  LLE Weight Bearing PWB  LLE Partial Weight Bearing Percentage or Pounds 50%  Transfers  Sit to Stand 4: Min assist;With upper extremity assist;From chair/3-in-1;With armrests  Sit to Stand Details (indicate cue type and reason) cues for hand placement and LE position  Stand to Sit 4: Min assist;With upper extremity assist;3: Mod assist;To chair/3-in-1  Stand to Sit Details cues for hand placement and LE position  Ambulation/Gait  Ambulation/Gait Assistance 4: Min assist;5: Supervision  Ambulation/Gait Assistance Details (indicate cue type and reason) cues for sequence, PWB, posture, use of UEs  Ambulation Distance (Feet) 100 Feet  Assistive device Rolling walker  Gait Pattern Step-to pattern;Trunk flexed  Total Joint Exercises  Ankle Circles/Pumps AROM;10 reps;Both  PT - End of Session  Activity Tolerance Patient tolerated treatment well  Patient left in chair;with call bell in reach  Nurse Communication Mobility status for ambulation;Mobility status for transfers  General  Behavior During Session [facility] for tasks performed  Cognition [facility] for tasks performed  PT - Assessment/Plan  Comments on Treatment Session pt progressing well  PT Plan Discharge plan remains appropriate;Frequency remains appropriate  PT Frequency 7X/week  Follow Up Recommendations Home health PT  Equipment Recommended Defer to next venue  Acute Rehab PT Goals  Pt will go Sit to Stand with supervision  PT Goal: Sit to Stand - Progress Progressing toward goal  Pt will go Stand to Sit with supervision  PT Goal: Stand to Sit - Progress Progressing toward goal  Pt will Ambulate 51 - 150 feet;with supervision;with rolling walker  PT Goal: Ambulate - Progress Progressing toward goal  Pt will  Perform Home Exercise Program with min assist  PT Goal: Perform Home Exercise Program - Progress Progressing toward goal

## 2011-11-12 NOTE — Progress Notes (Signed)
Physical Therapy Treatment Patient Details Name: Krystal Gutierrez MRN: [id number] DOB: 1941/04/30 Today's Date: 11/12/2011  PT Assessment/Plan  PT - Assessment/Plan Comments on Treatment Session: pt progressing well PT Plan: Discharge plan remains appropriate;Frequency remains appropriate PT Frequency: 7X/week Follow Up Recommendations: Home health PT Equipment Recommended: Defer to next venue PT Goals  Acute Rehab PT Goals Pt will go Sit to Stand: with supervision PT Goal: Sit to Stand - Progress: Progressing toward goal Pt will go Stand to Sit: with supervision PT Goal: Stand to Sit - Progress: Progressing toward goal Pt will Ambulate: 51 - 150 feet;with supervision;with rolling walker PT Goal: Ambulate - Progress: Progressing toward goal Pt will Perform Home Exercise Program: with min assist PT Goal: Perform Home Exercise Program - Progress: Progressing toward goal  PT Treatment Precautions/Restrictions  Precautions Precautions: Posterior Hip Precaution Comments: sign in room;  Restrictions Weight Bearing Restrictions: Yes LLE Weight Bearing: Partial weight bearing LLE Partial Weight Bearing Percentage or Pounds: 50% Mobility (including Balance) Transfers Sit to Stand: 4: Min assist;With upper extremity assist;From chair/3-in-1;With armrests Sit to Stand Details (indicate cue type and reason): cues for hand placement and LE position Stand to Sit: 4: Min assist;With upper extremity assist;3: Mod assist;To chair/3-in-1 Stand to Sit Details: cues for hand placement and LE position Ambulation/Gait Ambulation/Gait Assistance: 4: Min assist;5: Supervision Ambulation/Gait Assistance Details (indicate cue type and reason): cues for sequence, PWB, posture, use of UEs Ambulation Distance (Feet): 100 Feet Assistive device: Rolling walker Gait Pattern: Step-to pattern;Trunk flexed    Exercise  Total Joint Exercises Ankle Circles/Pumps: AROM;10 reps;Both Quad Sets: AROM;10  reps;Both Heel Slides: AAROM;Left;10 reps Hip ABduction/ADduction: AAROM;Left;10 reps End of Session PT - End of Session Equipment Utilized During Treatment: Gait belt Activity Tolerance: Patient tolerated treatment well Patient left: in chair;with call bell in reach General Behavior During Session: [facility] for tasks performed Cognition: [facility] for tasks performed  [facility] 11/12/2011, 10:29 AM

## 2011-11-12 NOTE — Progress Notes (Signed)
Subjective: 2 Days Post-Op Procedure(s) (LRB): TOTAL HIP ARTHROPLASTY (Left)   Patient reports pain as mild. No events throughout the night. Did well with PT. Pain well controlled with medication.  Objective:   VITALS:   Filed Vitals:   11/12/11 0530  BP: 109/66  Pulse: 79  Temp: 98.5 F (36.9 C)  Resp: 16    Neurovascular intact Dorsiflexion/Plantar flexion intact Incision: dressing C/D/I No cellulitis present Compartment soft  LABS  Basename 11/12/11 0410 11/11/11 0421  HGB 10.9* 11.4*  HCT 33.9* 35.3*  WBC 10.0 9.9  PLT 257 256     Basename 11/12/11 0410 11/11/11 0421  NA 134* 131*  K 3.9 4.0  BUN 10 7  CREATININE 0.61 0.61  GLUCOSE 122* 139*     Assessment/Plan: 2 Days Post-Op Procedure(s) (LRB): TOTAL HIP ARTHROPLASTY (Left)   Up with therapy D/C IV fluids Plan for discharge tomorrow to SNF, if she continues to do well    Anastasio Auerbach. Alessander Sikorski   PAC  11/12/2011, 7:21 AM

## 2011-11-13 LAB — CBC
HCT: 32.5 % — ABNORMAL LOW (ref 36.0–46.0)
MCH: 27 pg (ref 26.0–34.0)
MCHC: 31.4 g/dL (ref 30.0–36.0)
MCV: 86 fL (ref 78.0–100.0)
RDW: 16 % — ABNORMAL HIGH (ref 11.5–15.5)

## 2011-11-13 LAB — BASIC METABOLIC PANEL
BUN: 8 mg/dL (ref 6–23)
Chloride: 100 mEq/L (ref 96–112)
Creatinine, Ser: 0.48 mg/dL — ABNORMAL LOW (ref 0.50–1.10)
GFR calc Af Amer: >90 mL/min (ref >90)

## 2011-11-13 MED ORDER — DOCUSATE SODIUM 100 MG PO CAPS: 100.0000 mg | ORAL_CAPSULE | Freq: Two times a day (BID) | ORAL | Status: AC

## 2011-11-13 MED ORDER — ASPIRIN EC 325 MG PO TBEC: 325.0000 mg | DELAYED_RELEASE_TABLET | Freq: Two times a day (BID) | ORAL | Status: AC

## 2011-11-13 MED ORDER — POLYETHYLENE GLYCOL 3350 17 GM/SCOOP PO POWD: 17.0000 g | Freq: Two times a day (BID) | ORAL | Status: AC

## 2011-11-13 MED ORDER — HYDROCODONE-ACETAMINOPHEN 7.5-325 MG PO TABS: 1.0000 | ORAL_TABLET | ORAL | Status: AC | PRN

## 2011-11-13 MED ORDER — METHOCARBAMOL 500 MG PO TABS: 500.0000 mg | ORAL_TABLET | Freq: Four times a day (QID) | ORAL | Status: AC | PRN

## 2011-11-13 MED ORDER — FERROUS SULFATE 325 (65 FE) MG PO TABS: 325.0000 mg | ORAL_TABLET | Freq: Three times a day (TID) | ORAL | Status: DC

## 2011-11-13 NOTE — Progress Notes (Signed)
Physical Therapy Treatment Patient Details Name: Krystal Gutierrez MRN: [id number] DOB: 09/27/1940 Today's Date: 11/13/2011 Time: 4696-2952 PT Time Calculation (min): 28 min  PT Assessment / Plan / Recommendation Clinical Impression  pt is s/p left POSTERIOR THR and will benefit from, PT to maximize independence for next venue of care.    Follow Up Recommendations  Home health PT    Equipment Recommendations  Defer to next venue    Frequency 7X/week    Precautions / Restrictions Precautions Precautions: Posterior Hip Precaution Comments: sign in room; reviewed THP Restrictions Weight Bearing Restrictions: Yes LLE Weight Bearing: Partial weight bearing LLE Partial Weight Bearing Percentage or Pounds: 50%   Pertinent Vitals/Pain     Mobility  Bed Mobility Bed Mobility DO NOT USE: Yes Supine to Sit: 3: Mod assist;HOB elevated (Comment degrees) Sitting - Scoot to Edge of Bed: 4: Min assist Sit to Supine: 1: +2 Total assist;Patient percentage (comment);HOB flat (pt=50%) Details for Bed Mobility Assistance: pt in chair Transfers Sit to Stand: 4: Min guard;From chair/3-in-1;With upper extremity assist;With armrests Stand to Sit: 4: Min guard;With upper extremity assist;With armrests;To chair/3-in-1 Details for Transfer Assistance: cues for hand placement and THP Ambulation/Gait Ambulation/Gait Assistance: 4: Min guard Ambulation Distance (Feet): 120 Feet Assistive device: Rolling walker Ambulation/Gait Assistance Details: cues for sequence, PWB, posture, use of UEs Gait Pattern: Step-to pattern;Trunk flexed    Exercises Total Joint Exercises Ankle Circles/Pumps: AROM;10 reps;Both Quad Sets: AROM;Both;20 reps Heel Slides: AAROM;Left;10 reps Hip ABduction/ADduction: AAROM;Left;10 reps   PT Goals Acute Rehab PT Goals Potential to Achieve Goals: Good Pt will go Sit to Stand: with supervision PT Goal: Sit to Stand - Progress: Progressing toward goal Pt will go Stand to Sit:  with supervision PT Goal: Stand to Sit - Progress: Progressing toward goal Pt will Ambulate: 51 - 150 feet;with supervision;with rolling walker PT Goal: Ambulate - Progress: Progressing toward goal Pt will Perform Home Exercise Program: with min assist PT Goal: Perform Home Exercise Program - Progress: Met  Visit Information  Last PT Received On: 11/13/11 Assistance Needed: +1    Subjective Data  Subjective: pt reports she feels good Patient Stated Goal: to get better, walk   Cognition  Overall Cognitive Status: Appears within functional limits for tasks assessed/performed Arousal/Alertness: Awake/alert Orientation Level: Oriented X4 / Intact Behavior During Session: [facility] for tasks performed    Balance     End of Session PT - End of Session Equipment Utilized During Treatment: Gait belt Activity Tolerance: Patient tolerated treatment well Patient left: in chair;with call bell/phone within reach;with family/visitor present Nurse Communication: Mobility status    [facility] 11/13/2011, 10:47 AM

## 2011-11-13 NOTE — Progress Notes (Signed)
Patient cleared for discharge. Patient accepted at [facility]. Packet copied and placed in wall. ptar called for transportation. Patient and daughter at bedside informed.  Tylyn Derwin C. Marquette Blodgett MSW, LCSW [phone]

## 2011-11-13 NOTE — Progress Notes (Signed)
Subjective: 3 Days Post-Op Procedure(s) (LRB): TOTAL HIP ARTHROPLASTY (Left)   Patient reports pain as mild. Pain well controlled. No events throughout the night. Ready to be discharged to SNF.  Objective:   VITALS:   Filed Vitals:   11/13/11 0630  BP: 102/64  Pulse: 68  Temp: 98.4 F (36.9 C)  Resp: 18    Neurovascular intact Dorsiflexion/Plantar flexion intact Incision: dressing C/D/I No cellulitis present Compartment soft  LABS  Basename 11/13/11 0411 11/12/11 0410 11/11/11 0421  HGB 10.2* 10.9* 11.4*  HCT 32.5* 33.9* 35.3*  WBC 9.9 10.0 9.9  PLT 232 257 256     Basename 11/13/11 0411 11/12/11 0410 11/11/11 0421  NA 134* 134* 131*  K 3.8 3.9 4.0  BUN 8 10 7   CREATININE 0.48* 0.61 0.61  GLUCOSE 115* 122* 139*     Assessment/Plan: 3 Days Post-Op Procedure(s) (LRB): TOTAL HIP ARTHROPLASTY (Left)   Up with therapy x1 Discharge to SNF today after PT Follow up in 2 weeks at [facility].  Follow-up Information    Follow up with OLIN,Carl Butner D in 2 weeks.   Contact information:   [facility] [address], [address] [place] [address] [phone]          Anastasio Auerbach. Steffan Caniglia   PAC  11/13/2011, 8:35 AM

## 2011-11-13 NOTE — Discharge Summary (Signed)
Physician Discharge Summary  Patient ID: Krystal Gutierrez MRN: [id number] DOB/AGE: 73-Aug-1942 73 y.o.  Admit date: 11/10/2011 Discharge date: 11/13/2011  Procedures:  Procedure(s) (LRB): TOTAL HIP ARTHROPLASTY (Left)  Attending Physician:  Dr. Durene Romans   Admission Diagnoses: Left hip OA and pain   Discharge Diagnoses:  Principal Problem:  *S/P left THA Apnea, sleep   Hyperlipidemia   Hypertension   Arthritis   HPI: Pt is a 71 y.o. female complaining of left hip pain for 1-2 years. Pain had continually increased since the beginning. X-rays in the clinic show end-stage arthritic changes of the left hip. Pt has tried various conservative treatments which have failed to alleviate their symptoms, including multiple NSAIDs.. Various options are discussed with the patient. Risks, benefits and expectations were discussed with the patient. Patient understand the risks, benefits and expectations and wishes to proceed with surgery.   PCP: No primary provider on file.   Discharged Condition: good  Hospital Course:  Patient underwent the above stated procedure on 11/10/2011. Patient tolerated the procedure well and brought to the recovery room in good condition and subsequently to the floor.  POD #1 BP: 126/65 ; Pulse: 70 ; Temp: 98 F (36.7 C) ; Resp: 18  Pt's foley was removed, as well as the hemovac drain removed. IV was changed to a saline lock. Patient reports pain as mild. Pain well controlled. No events throughout the night. Neurovascular intact, dorsiflexion/plantar flexion intact, incision: dressing C/Gutierrez/I, no cellulitis present and compartment soft.   LABS  Basename  11/11/11 0421   HGB  11.4  HCT  35.3   POD #2  BP: 109/66 ; Pulse: 79 ; Temp: 98.5 F (36.9 C) ; Resp: 16  Patient reports pain as mild. No events throughout the night. Did well with PT. Pain well controlled with medication. Neurovascular intact, dorsiflexion/plantar flexion intact, incision: dressing C/Gutierrez/I, no  cellulitis present and compartment soft.   LABS  Basename  11/12/11 0410   HGB  10.9  HCT  33.9   POD #3  BP: 102/64 ; Pulse: 68 ; Temp: 98.4 F (36.9 C) ; Resp: 18  Patient reports pain as mild. Pain well controlled. No events throughout the night. Ready to be discharged to SNF.  Neurovascular intact, dorsiflexion/plantar flexion intact, incision: dressing C/Gutierrez/I, no cellulitis present and compartment soft.   LABS  Basename  11/13/11 0411   HGB  10.2  HCT  32.5    Discharge Exam: General appearance: alert, cooperative and no distress Extremities: Homans sign is negative, no sign of DVT, no edema, redness or tenderness in the calves or thighs and no ulcers, gangrene or trophic changes  Disposition: SNF with follow up in 2 weeks  Follow-up Information    Follow up with OLIN,Krystal Gutierrez in 2 weeks.   Contact information:   [facility] [address], [address] [place] [address] [phone]          Discharge Orders    Future Orders Please Complete By Expires   Diet - low sodium heart healthy      Call MD / Call 911      Comments:   If you experience chest pain or shortness of breath, CALL 911 and be transported to the hospital emergency room.  If you develope a fever above 101 F, pus (white drainage) or increased drainage or redness at the wound, or calf pain, call your surgeon's office.   Discharge instructions      Comments:   Maintain  surgical dressing for 8 days, then replace with gauze and tape. Keep the area dry and clean until follow up. Follow up in 2 weeks at [facility]. Call with any questions or concerns.     Constipation Prevention      Comments:   Drink plenty of fluids.  Prune juice may be helpful.  You may use a stool softener, such as Colace (over the counter) 100 mg twice a day.  Use MiraLax (over the counter) for constipation as needed.   Increase activity slowly as tolerated      Weight Bearing as taught  in Physical Therapy      Comments:   Use a walker or crutches as instructed.   Driving restrictions      Comments:   No driving for 4 weeks   Change dressing      Comments:   Maintain surgical dressing for 8 days, then replace with 4x4 guaze and tape. Keep the area dry and clean.   TED hose      Comments:   Use stockings (TED hose) for 2 weeks on both leg(s).  You may remove them at night for sleeping.      Current Discharge Medication List    START taking these medications   Details  aspirin EC 325 MG tablet Take 1 tablet (325 mg total) by mouth 2 (two) times daily. X 4 weeks Qty: 60 tablet, Refills: 0    docusate sodium (COLACE) 100 MG capsule Take 1 capsule (100 mg total) by mouth 2 (two) times daily. Refills: 0    ferrous sulfate 325 (65 FE) MG tablet Take 1 tablet (325 mg total) by mouth 3 (three) times daily after meals.    methocarbamol (ROBAXIN) 500 MG tablet Take 1 tablet (500 mg total) by mouth every 6 (six) hours as needed (muscle spasms). Qty: 50 tablet, Refills: 0    polyethylene glycol powder (MIRALAX) powder Take 17 g by mouth 2 (two) times daily. Refills: 0      CONTINUE these medications which have CHANGED   Details  HYDROcodone-acetaminophen (NORCO) 7.5-325 MG per tablet Take 1-2 tablets by mouth every 4 (four) hours as needed for pain. Qty: 120 tablet, Refills: 0      CONTINUE these medications which have NOT CHANGED   Details  metoprolol succinate (TOPROL-XL) 100 MG 24 hr tablet Take 100 mg by mouth every morning. Take with or immediately following a meal.    NIFEdipine (ADALAT CC) 90 MG 24 hr tablet Take 90 mg by mouth every morning.     Cholecalciferol (VITAMIN D3) 3000 UNITS TABS Take 1,000 Units by mouth every morning. Take 1000 mg daily    Cholecalciferol 1000 UNITS capsule Take 1,000 Units by mouth daily. Vitamin D3    fish oil-omega-3 fatty acids 1000 MG capsule Take 1 g by mouth every morning.     Multiple Vitamins-Minerals (PX SENIOR  VITAMIN PO) Take 1 tablet by mouth every morning.     pravastatin (PRAVACHOL) 40 MG tablet Take 40 mg by mouth every evening.       STOP taking these medications     traMADol (ULTRAM) 50 MG tablet Comments:  Reason for Stopping:       aspirin 81 MG tablet Comments:  Reason for Stopping:       ibuprofen (ADVIL,MOTRIN) 200 MG tablet Comments:  Reason for Stopping:          Signed: Anastasio Auerbach. Wally Shevchenko   PAC  11/13/2011, 8:47 AM

## 2011-11-13 NOTE — Progress Notes (Signed)
Daughter at bedside, Social Worker has made arrangements for discharge.

## 2011-11-20 ENCOUNTER — Encounter (HOSPITAL_COMMUNITY): Payer: Self-pay | Admitting: Orthopedic Surgery

## 2012-02-04 ENCOUNTER — Encounter: Payer: Self-pay | Admitting: Internal Medicine

## 2012-03-24 ENCOUNTER — Encounter: Payer: Self-pay | Admitting: *Deleted

## 2012-04-06 ENCOUNTER — Ambulatory Visit: Payer: Medicare Other | Admitting: Internal Medicine

## 2012-04-12 ENCOUNTER — Encounter: Payer: Self-pay | Admitting: Internal Medicine

## 2012-04-12 ENCOUNTER — Ambulatory Visit (INDEPENDENT_AMBULATORY_CARE_PROVIDER_SITE_OTHER): Payer: Medicare Other | Admitting: Internal Medicine

## 2012-04-12 VITALS — BP 110/60 | HR 68 | Ht 61.0 in | Wt 249.4 lb

## 2012-04-12 DIAGNOSIS — K429 Umbilical hernia without obstruction or gangrene: Secondary | ICD-10-CM

## 2012-04-12 DIAGNOSIS — R933 Abnormal findings on diagnostic imaging of other parts of digestive tract: Secondary | ICD-10-CM

## 2012-04-12 NOTE — Progress Notes (Signed)
Krystal Gutierrez 09/27/1940 MRN [id number]    History of Present Illness:  This is a 71 year old African American female who has a large umbilical hernia which came to our attention during her recent colonoscopy in February 2013 which was an incomplete exam due to coiling of the scope within the hernia. A CT scan of the abdomen showed A 7.1 cm intraumbilical hernia with loop of the sigmoid colon in it and a 6 cm right adnexal dermoid. She is aware of the hernia when she stands up but denies any abdominal pain or change in bowel habits. Her bowels are regular. She denies constipation or bleeding. She has never had any abdominal operations. She lives in [place] and her primary care physician is also there. She comes to [facility] to stay with her daughter. She just recovered from a hip surgery for which she was cleared by cardiologist, Dr. Mayford Knife and did very well. Her daughter would like her to have the surgery in [place].   Past Medical History  Diagnosis Date  . Hip pain, left   . Hyperlipidemia   . Arthritis   . Umbilical hernia   . Edema     bilateral legs  . Apnea, sleep     12/12  [facility]- report on chart   Dr Mayford Knife follows  . Hypertension     EKG 10/16/11- abnormal- Clearance with  OV with stress test, eccho Dr Mayford Knife 10/26/11 on chart  . Diverticulosis    Past Surgical History  Procedure Date  . Total hip arthroplasty 11/10/2011    Procedure: TOTAL HIP ARTHROPLASTY;  Surgeon: Shelda Pal, MD;  Location: [facility] ORS;  Service: Orthopedics;  Laterality: Left;    reports that she has never smoked. She has never used smokeless tobacco. She reports that she does not drink alcohol or use illicit drugs. family history includes Arthritis in her father; Diabetes in her mother; and Stroke in her father. No Known Allergies      Review of Systems: Denies heartburn nausea vomiting positive for obesity  The remainder of the 10 point ROS is negative except as outlined in H&P   Physical  Exam: General appearance  Well developed, in no distress. Overweight Eyes- non icteric. HEENT nontraumatic, normocephalic. Mouth no lesions, tongue papillated, no cheilosis. Neck supple without adenopathy, thyroid not enlarged, no carotid bruits, no JVD. Lungs Clear to auscultation bilaterally. Cor normal S1, normal S2, regular rhythm, no murmur,  quiet precordium. Abdomen: Obese; in standing position, there is a visible hernia to the right of the umbilicus about 10 cm in diameter. In the lying down position, hernia is again noted and is reducible although the loop of the bowel never goes away completely. There is no tenderness or redness. Her bowel sounds are active. The diameter of the hernia in the lying down position is about 10 cm. Rectal: Not repeated. Extremities trace pedal edema. Skin no lesions. Neurological alert and oriented x 3. Psychological normal mood and affect.  Assessment and Plan:  Problem #1 Large umbilical hernia with a loop of sigmoid colon contained within which is not causing any clinical obstruction. We don't know how long the hernia has been there but she thinks it has gradually developed over the past several years. Although this type of hernia has a low risk of strangulation, it could cause problems if she becomes constipated. She is high risk for surgery because of her large size and higher risk for recurrence of the hernia. I have discussed this with the patient as  well as with her daughter and suggested that we get a surgical opinion as to the risks, benefits and possible recurrence of the hernia. In the meantime, she will continue a high fiber diet and will notify us if there are any acute problems.   Problem, #2- morbid obesity  Problem #3- surgical clearance- she was cleared by Dr Mayford Knife for her recent hip surgery     04/12/2012 Krystal Gutierrez

## 2012-04-12 NOTE — Patient Instructions (Addendum)
You have been scheduled for an appointment with Dr Karie Soda at [facility]. Your appointment is on Tuesday, 04/26/12 @ 3:15 pm. Please arrive at 2:45 pm for registration. Make certain to bring a list of current medications, including any over the counter medications or vitamins. Also bring your co-pay if you have one as well as your insurance cards. [facility] is located at [address]. Should you need to reschedule your appointment, please contact them at [phone].  DR Armanda Magic, Dr Everrett Coombe, [place]

## 2012-04-26 ENCOUNTER — Encounter (INDEPENDENT_AMBULATORY_CARE_PROVIDER_SITE_OTHER): Payer: Self-pay | Admitting: Surgery

## 2012-04-26 ENCOUNTER — Ambulatory Visit (INDEPENDENT_AMBULATORY_CARE_PROVIDER_SITE_OTHER): Payer: Medicare Other | Admitting: Surgery

## 2012-04-26 VITALS — BP 132/84 | HR 52 | Temp 98.2°F | Resp 18 | Ht 61.0 in | Wt 250.2 lb

## 2012-04-26 DIAGNOSIS — G4733 Obstructive sleep apnea (adult) (pediatric): Secondary | ICD-10-CM | POA: Insufficient documentation

## 2012-04-26 DIAGNOSIS — K439 Ventral hernia without obstruction or gangrene: Secondary | ICD-10-CM

## 2012-04-26 DIAGNOSIS — I10 Essential (primary) hypertension: Secondary | ICD-10-CM | POA: Insufficient documentation

## 2012-04-26 DIAGNOSIS — K5790 Diverticulosis of intestine, part unspecified, without perforation or abscess without bleeding: Secondary | ICD-10-CM | POA: Insufficient documentation

## 2012-04-26 DIAGNOSIS — K436 Other and unspecified ventral hernia with obstruction, without gangrene: Secondary | ICD-10-CM | POA: Insufficient documentation

## 2012-04-26 NOTE — Progress Notes (Signed)
Subjective:     Patient ID: Krystal Gutierrez, female   DOB: Nov 29, 1940, 71 y.o.   MRN: [id number]  HPI  Krystal Gutierrez  12/29/1940 [id number]  Patient Care Team: Cephas Darby, MD as PCP - General (Cardiology) Hart Carwin, MD as Consulting Physician (Gastroenterology)  This patient is a 71 y.o.female who presents today for surgical evaluation at the request of [name].   Reason for evaluation: Ventral wall hernia.  Pleasant morbidly obese female.  [place] her daughter to help take care of her granddaughter.  Her daughters been getting her to deficits with cardiology orthopedics and Septra.  Had repair of a hip by Dr.: Early in the year.  Had cardiac clearance by Dr. Mayford Knife.  Had a colonoscopy set up.  Dr. [name] was unable to complete it secondary to transverse colon going up into a hernia around the bellybutton.  CT scan confirmed this.  Patient was sent to me for evaluation to see if hernia defect needs to be fixed.  Patient has sleep apnea on CPAP.  Stable.  Can walk 20 minutes without difficulty.  No prior abdominal surgeries.  Father lived to 80.  Mother lived to 48.  Some occasional discomfort after eating but for the most part it does not bother her.  Patient Active Problem List  Diagnosis  . S/P left THA  . Hypertension  . Apnea, sleep  . Diverticulosis  . Ventral hernia    Past Medical History  Diagnosis Date  . Hip pain, left   . Hyperlipidemia   . Arthritis   . Ventral hernia     periumbilical  . Edema     bilateral legs  . Apnea, sleep     12/12  SEVERE- report on chart   Dr Mayford Knife follows  . Hypertension     EKG 10/16/11- abnormal- Clearance with  OV with stress test, eccho Dr Mayford Knife 10/26/11 on chart  . Diverticulosis     Past Surgical History  Procedure Date  . Total hip arthroplasty 11/10/2011    Procedure: TOTAL HIP ARTHROPLASTY;  Surgeon: Shelda Pal, MD;  Location: [facility];  Service: Orthopedics;   Laterality: Left;    History   Social History  . Marital Status: Married    Spouse Name: N/A    Number of Children: 2  . Years of Education: N/A   Occupational History  . retired    Social History Main Topics  . Smoking status: Never Smoker   . Smokeless tobacco: Never Used  . Alcohol Use: No  . Drug Use: No  . Sexually Active: Not on file   Other Topics Concern  . Not on file   Social History Narrative  . No narrative on file    Family History  Problem Relation Age of Onset  . Diabetes Mother   . Stroke Father   . Arthritis Father     Current Outpatient Prescriptions  Medication Sig Dispense Refill  . aspirin 81 MG tablet Take 81 mg by mouth daily.      . Cholecalciferol 1000 UNITS capsule Take 1,000 Units by mouth daily. Vitamin D3      . fish oil-omega-3 fatty acids 1000 MG capsule Take 1 g by mouth every morning.       . metoprolol succinate (TOPROL-XL) 100 MG 24 hr tablet Take 100 mg by mouth every morning. Take with or immediately following a meal.      . Multiple Vitamins-Minerals (  PX SENIOR VITAMIN PO) Take 1 tablet by mouth every morning.       Marland Kitchen NIFEdipine (ADALAT CC) 90 MG 24 hr tablet Take 90 mg by mouth every morning.       . pravastatin (PRAVACHOL) 40 MG tablet Take 40 mg by mouth every evening.          No Known Allergies  BP 132/84  Pulse 52  Temp 98.2 F (36.8 C) (Temporal)  Resp 18  Ht 5\' 1"  (1.549 m)  Wt 250 lb 3.2 oz (113.49 kg)  BMI 47.27 kg/m2  SpO2 98%  No results found.   Review of Systems  Constitutional: Negative for fever, chills, diaphoresis, appetite change and fatigue.  HENT: Negative for ear pain, sore throat, trouble swallowing, neck pain and ear discharge.   Eyes: Negative for photophobia, discharge and visual disturbance.  Respiratory: Negative for cough, choking, chest tightness and shortness of breath.   Cardiovascular: Negative for chest pain and palpitations.  Gastrointestinal: Negative for nausea, vomiting,  abdominal pain, diarrhea, constipation, anal bleeding and rectal pain.  Genitourinary: Negative for dysuria, frequency and difficulty urinating.  Musculoskeletal: Negative for myalgias and gait problem.  Skin: Negative for color change, pallor and rash.  Neurological: Negative for dizziness, speech difficulty, weakness and numbness.  Hematological: Negative for adenopathy.  Psychiatric/Behavioral: Negative for confusion and agitation. The patient is not nervous/anxious.        Objective:   Physical Exam  Constitutional: She is oriented to person, place, and time. She appears well-developed and well-nourished. No distress.  HENT:  Head: Normocephalic.  Mouth/Throat: Oropharynx is clear and moist. No oropharyngeal exudate.  Eyes: Conjunctivae normal and EOM are normal. Pupils are equal, round, and reactive to light. No scleral icterus.  Neck: Normal range of motion. Neck supple. No tracheal deviation present.  Cardiovascular: Normal rate, regular rhythm and intact distal pulses.   Pulmonary/Chest: Effort normal and breath sounds normal. No respiratory distress. She exhibits no tenderness.  Abdominal: Soft. She exhibits no distension, no pulsatile liver and no mass. There is tenderness in the periumbilical area. There is no CVA tenderness. A hernia is present. Hernia confirmed negative in the right inguinal area and confirmed negative in the left inguinal area.         Morbidly obese with moderate panniculus  Genitourinary: No vaginal discharge found.  Musculoskeletal: Normal range of motion. She exhibits edema. She exhibits no tenderness.  Lymphadenopathy:    She has no cervical adenopathy.       Right: No inguinal adenopathy present.       Left: No inguinal adenopathy present.  Neurological: She is alert and oriented to person, place, and time. No cranial nerve deficit. She exhibits normal muscle tone. Coordination normal.  Skin: Skin is warm and dry. No rash noted. She is not  diaphoretic. No erythema.  Psychiatric: She has a normal mood and affect. Her behavior is normal. Judgment and thought content normal.       Assessment:     Moderate probable incarcerated periumbilical ventral hernia.  5 x 5 cm at the fascia at the very least.  Much larger at the skin.    Plan:     Given the fact that there is a loop of transverse colon incarcerated within this and her parents lived several decades beyond her, I am concerned that she is at risk for strangulation in a life-threatening issue.  I recommend she consider surgery since she is a reasonable operative risk.  Morbid obesity does  increase the likelihood of recurrence.  I suspect she will need an On-Q pump as well as at least overnight stay.  I did caution her that pain associated with this can last for a few months at least.  Eventually however, it should come down.  Her daughter feel strongly she needs surgery.  The patient seems interested in it as well.  I discussed the procedure with him:  The anatomy & physiology of the abdominal wall was discussed.  The pathophysiology of hernias was discussed.  Natural history risks without surgery including progeressive enlargement, pain, incarceration & strangulation was discussed.   Contributors to complications such as smoking, obesity, diabetes, prior surgery, etc were discussed.   I feel the risks of no intervention will lead to serious problems that outweigh the operative risks; therefore, I recommended surgery to reduce and repair the hernia.  I explained laparoscopic techniques with possible need for an open approach.  I noted the probable use of mesh to patch and/or buttress the hernia repair  Risks such as bleeding, infection, abscess, need for further treatment, heart attack, death, and other risks were discussed.  I noted a good likelihood this will help address the problem.   Goals of post-operative recovery were discussed as well.  Possibility that this will not correct  all symptoms was explained.  I stressed the importance of low-impact activity, aggressive pain control, avoiding constipation, & not pushing through pain to minimize risk of post-operative chronic pain or injury. Possibility of reherniation especially with smoking, obesity, diabetes, immunosuppression, and other health conditions was discussed.  We will work to minimize complications.     An educational handout further explaining the pathology & treatment options was given as well.  Questions were answered.  The patient expresses understanding & wishes to proceed with surgery.

## 2012-04-26 NOTE — Patient Instructions (Addendum)
See the Handout(s) we gave you.  Consider surgery.  Please call our office at [phone] if you wish to schedule surgery or if you have further questions / concerns.   Hernia A hernia occurs when an internal organ pushes out through a weak spot in the abdominal wall. Hernias most commonly occur in the groin and around the navel. Hernias often can be pushed back into place (reduced). Most hernias tend to get worse over time. Some abdominal hernias can get stuck in the opening (irreducible or incarcerated hernia) and cannot be reduced. An irreducible abdominal hernia which is tightly squeezed into the opening is at risk for impaired blood supply (strangulated hernia). A strangulated hernia is a medical emergency. Because of the risk for an irreducible or strangulated hernia, surgery may be recommended to repair a hernia. CAUSES   Heavy lifting.  Prolonged coughing.  Straining to have a bowel movement.  A cut (incision) made during an abdominal surgery. HOME CARE INSTRUCTIONS   Bed rest is not required. You may continue your normal activities.  Avoid lifting more than 10 pounds (4.5 kg) or straining.  Cough gently. If you are a smoker it is best to stop. Even the best hernia repair can break down with the continual strain of coughing. Even if you do not have your hernia repaired, a cough will continue to aggravate the problem.  Do not wear anything tight over your hernia. Do not try to keep it in with an outside bandage or truss. These can damage abdominal contents if they are trapped within the hernia sac.  Eat a normal diet.  Avoid constipation. Straining over long periods of time will increase hernia size and encourage breakdown of repairs. If you cannot do this with diet alone, stool softeners may be used. SEEK IMMEDIATE MEDICAL CARE IF:   You have a fever.  You develop increasing abdominal pain.  You feel nauseous or vomit.  Your hernia is stuck outside the abdomen, looks  discolored, feels hard, or is tender.  You have any changes in your bowel habits or in the hernia that are unusual for you.  You have increased pain or swelling around the hernia.  You cannot push the hernia back in place by applying gentle pressure while lying down. MAKE SURE YOU:   Understand these instructions.  Will watch your condition.  Will get help right away if you are not doing well or get worse. Document Released: 07/13/2005 Document Revised: 10/05/2011 Document Reviewed: 03/01/2008 [employer] Patient Information 2013 [employer].  

## 2012-05-04 ENCOUNTER — Encounter (INDEPENDENT_AMBULATORY_CARE_PROVIDER_SITE_OTHER): Payer: Self-pay

## 2012-06-13 ENCOUNTER — Encounter (HOSPITAL_COMMUNITY): Payer: Self-pay | Admitting: Pharmacy Technician

## 2012-06-13 NOTE — Progress Notes (Signed)
EKG 3/13 chart, chest 3/13 EPIC,  Stress test with eccho 4/13 on chart,  LOV Dr Mayford Knife 9/13 with last sleep study 12/12 on chart

## 2012-06-13 NOTE — Patient Instructions (Addendum)
20 Krystal Gutierrez  06/13/2012   Your procedure is scheduled on:  06/16/12  Thursday  Surgery [id number]  Report to [facility] at  0730     AM.  Call this number if you have problems the morning of surgery: [phone]      Remember: NO ANTIINFLAMMATORIES OR HERBAL MEDICINE 4 DAYS BEFORE SURGERY  Do not eat food  Or drink :After Midnight. Wednesday NIGHT   Take these medicines the morning of surgery with A SIP OF WATER: Nifedipine,   METAPROLOL   .BRING CPAP MASK AND TUBING WITH YOU TO HOSPITAL  Contacts, dentures or partial plates can not be worn to surgery  Leave suitcase in the car. After surgery it may be brought to your room.  For patients admitted to the hospital, checkout time is 11:00 AM day of  discharge.             SPECIAL INSTRUCTIONS- SEE [place] PREPARING FOR SURGERY INSTRUCTION SHEET-     DO NOT WEAR JEWELRY, LOTIONS, POWDERS, OR PERFUMES.  WOMEN-- DO NOT SHAVE LEGS OR UNDERARMS FOR 12 HOURS BEFORE SHOWERS. MEN MAY SHAVE FACE.  Patients discharged the day of surgery will not be allowed to drive home. IF going home the day of surgery, you must have a driver and someone to stay with you for the first 24 hours  Name and phone number of your driver:    Daughter                                                                     Please read over the following fact sheets that you were given: MRSA Information, Incentive Spirometry Sheet, Blood Transfusion Sheet  Information                                                                                   Reed Eifert  PST [phone]  [phone]

## 2012-06-14 ENCOUNTER — Encounter (HOSPITAL_COMMUNITY): Payer: Self-pay

## 2012-06-14 ENCOUNTER — Encounter (HOSPITAL_COMMUNITY)
Admission: RE | Admit: 2012-06-14 | Discharge: 2012-06-14 | Disposition: A | Discharge status: Home / Self Care | Payer: Medicare Other | Source: Ambulatory Visit | Attending: Surgery | Admitting: Surgery

## 2012-06-14 LAB — CBC
Hemoglobin: 13.8 g/dL (ref 12.0–15.0)
MCH: 27.4 pg (ref 26.0–34.0)
RBC: 5.04 MIL/uL (ref 3.87–5.11)

## 2012-06-14 LAB — BASIC METABOLIC PANEL
CO2: 24 mEq/L (ref 19–32)
Chloride: 102 mEq/L (ref 96–112)
Glucose, Bld: 100 mg/dL — ABNORMAL HIGH (ref 70–99)
Potassium: 4.1 mEq/L (ref 3.5–5.1)
Sodium: 137 mEq/L (ref 135–145)

## 2012-06-14 LAB — SURGICAL PCR SCREEN
MRSA, PCR: NEGATIVE
Staphylococcus aureus: NEGATIVE

## 2012-06-14 NOTE — Progress Notes (Addendum)
Clearance Dr Mayford Knife with note in EPIC dated 04/27/12

## 2012-06-16 ENCOUNTER — Encounter (HOSPITAL_COMMUNITY): Payer: Self-pay

## 2012-06-16 ENCOUNTER — Encounter (HOSPITAL_COMMUNITY): Payer: Self-pay | Admitting: Anesthesiology

## 2012-06-16 ENCOUNTER — Encounter (HOSPITAL_COMMUNITY): Payer: Self-pay | Admitting: *Deleted

## 2012-06-16 ENCOUNTER — Encounter (HOSPITAL_COMMUNITY)
Admission: RE | Disposition: A | Discharge status: Home / Self Care | Payer: Self-pay | Source: Ambulatory Visit | Attending: Surgery

## 2012-06-16 ENCOUNTER — Observation Stay (HOSPITAL_COMMUNITY)
Admission: RE | Admit: 2012-06-16 | Discharge: 2012-06-18 | Disposition: A | Discharge status: Home / Self Care | Payer: Medicare Other | Source: Ambulatory Visit | Attending: Surgery | Admitting: Surgery

## 2012-06-16 ENCOUNTER — Ambulatory Visit (HOSPITAL_COMMUNITY): Payer: Medicare Other | Admitting: Anesthesiology

## 2012-06-16 DIAGNOSIS — Z96649 Presence of unspecified artificial hip joint: Secondary | ICD-10-CM | POA: Insufficient documentation

## 2012-06-16 DIAGNOSIS — G4730 Sleep apnea, unspecified: Secondary | ICD-10-CM | POA: Insufficient documentation

## 2012-06-16 DIAGNOSIS — Z6841 Body Mass Index (BMI) 40.0 and over, adult: Secondary | ICD-10-CM | POA: Insufficient documentation

## 2012-06-16 DIAGNOSIS — G4733 Obstructive sleep apnea (adult) (pediatric): Secondary | ICD-10-CM | POA: Diagnosis present

## 2012-06-16 DIAGNOSIS — K430 Incisional hernia with obstruction, without gangrene: Secondary | ICD-10-CM

## 2012-06-16 DIAGNOSIS — Z01812 Encounter for preprocedural laboratory examination: Secondary | ICD-10-CM | POA: Insufficient documentation

## 2012-06-16 DIAGNOSIS — K436 Other and unspecified ventral hernia with obstruction, without gangrene: Principal | ICD-10-CM | POA: Insufficient documentation

## 2012-06-16 DIAGNOSIS — K439 Ventral hernia without obstruction or gangrene: Secondary | ICD-10-CM

## 2012-06-16 DIAGNOSIS — I10 Essential (primary) hypertension: Secondary | ICD-10-CM | POA: Insufficient documentation

## 2012-06-16 DIAGNOSIS — E785 Hyperlipidemia, unspecified: Secondary | ICD-10-CM | POA: Insufficient documentation

## 2012-06-16 HISTORY — PX: INSERTION OF MESH: SHX5868

## 2012-06-16 HISTORY — PX: VENTRAL HERNIA REPAIR: SHX424

## 2012-06-16 LAB — CREATININE, SERUM
Creatinine, Ser: 0.55 mg/dL (ref 0.50–1.10)
GFR calc non Af Amer: >90 mL/min (ref >90)

## 2012-06-16 LAB — CBC
Hemoglobin: 13.2 g/dL (ref 12.0–15.0)
MCHC: 31.5 g/dL (ref 30.0–36.0)
RBC: 5.06 MIL/uL (ref 3.87–5.11)

## 2012-06-16 SURGERY — REPAIR, HERNIA, VENTRAL, LAPAROSCOPIC
Anesthesia: General | Site: Abdomen | Wound class: Clean

## 2012-06-16 MED ORDER — MAGIC MOUTHWASH
15.0000 mL | Freq: Four times a day (QID) | ORAL | Status: DC | PRN
  Filled 2012-06-16: qty 15

## 2012-06-16 MED ORDER — DIPHENHYDRAMINE HCL 50 MG/ML IJ SOLN: 12.5000 mg | Freq: Four times a day (QID) | INTRAMUSCULAR | Status: DC | PRN

## 2012-06-16 MED ORDER — FENTANYL CITRATE 0.05 MG/ML IJ SOLN: INTRAMUSCULAR | Status: DC | PRN

## 2012-06-16 MED ORDER — ASPIRIN 81 MG PO TABS: 81.0000 mg | ORAL_TABLET | Freq: Every day | ORAL | Status: DC

## 2012-06-16 MED ORDER — CEFAZOLIN SODIUM-DEXTROSE 2-3 GM-% IV SOLR
2.0000 g | INTRAVENOUS | Status: AC
  Administered 2012-06-16: 2 g via INTRAVENOUS

## 2012-06-16 MED ORDER — NIFEDIPINE ER OSMOTIC RELEASE 90 MG PO TB24
90.0000 mg | ORAL_TABLET | Freq: Every day | ORAL | Status: DC
  Administered 2012-06-17 – 2012-06-18 (×2): 90 mg via ORAL
  Filled 2012-06-16 (×2): qty 1

## 2012-06-16 MED ORDER — METOPROLOL TARTRATE 12.5 MG HALF TABLET
12.5000 mg | ORAL_TABLET | Freq: Two times a day (BID) | ORAL | Status: DC
  Administered 2012-06-16 – 2012-06-17 (×2): 12.5 mg via ORAL
  Filled 2012-06-16 (×4): qty 1

## 2012-06-16 MED ORDER — LACTATED RINGERS IV SOLN
INTRAVENOUS | Status: DC | PRN
  Administered 2012-06-16 (×2): via INTRAVENOUS

## 2012-06-16 MED ORDER — HYDROMORPHONE HCL PF 1 MG/ML IJ SOLN: 0.2500 mg | INTRAMUSCULAR | Status: DC | PRN

## 2012-06-16 MED ORDER — MAGNESIUM HYDROXIDE 400 MG/5ML PO SUSP: 30.0000 mL | Freq: Two times a day (BID) | ORAL | Status: DC | PRN

## 2012-06-16 MED ORDER — ACETAMINOPHEN 10 MG/ML IV SOLN: 1000.0000 mg | Freq: Once | INTRAVENOUS | Status: DC | PRN

## 2012-06-16 MED ORDER — CISATRACURIUM BESYLATE (PF) 10 MG/5ML IV SOLN
INTRAVENOUS | Status: DC | PRN
  Administered 2012-06-16: 8 mg via INTRAVENOUS
  Administered 2012-06-16: 4 mg via INTRAVENOUS
  Administered 2012-06-16: 2 mg via INTRAVENOUS

## 2012-06-16 MED ORDER — SODIUM CHLORIDE 0.9 % IJ SOLN: 3.0000 mL | Freq: Two times a day (BID) | INTRAMUSCULAR | Status: DC

## 2012-06-16 MED ORDER — OXYCODONE HCL 5 MG PO TABS: 5.0000 mg | ORAL_TABLET | Freq: Once | ORAL | Status: DC | PRN

## 2012-06-16 MED ORDER — OXYCODONE HCL 5 MG PO TABS
5.0000 mg | ORAL_TABLET | ORAL | Status: DC | PRN
  Administered 2012-06-16 – 2012-06-17 (×2): 10 mg via ORAL
  Filled 2012-06-16 (×2): qty 2

## 2012-06-16 MED ORDER — ONDANSETRON HCL 4 MG/2ML IJ SOLN
INTRAMUSCULAR | Status: DC | PRN
  Administered 2012-06-16 (×2): 2 mg via INTRAVENOUS

## 2012-06-16 MED ORDER — LIDOCAINE HCL (CARDIAC) 20 MG/ML IV SOLN
INTRAVENOUS | Status: DC | PRN
  Administered 2012-06-16: 100 mg via INTRAVENOUS

## 2012-06-16 MED ORDER — ACETAMINOPHEN 10 MG/ML IV SOLN
INTRAVENOUS | Status: AC
  Filled 2012-06-16: qty 100

## 2012-06-16 MED ORDER — OXYCODONE HCL 5 MG/5ML PO SOLN
5.0000 mg | Freq: Once | ORAL | Status: DC | PRN
  Filled 2012-06-16: qty 5

## 2012-06-16 MED ORDER — ACETAMINOPHEN 10 MG/ML IV SOLN
INTRAVENOUS | Status: DC | PRN
  Administered 2012-06-16: 1000 mg via INTRAVENOUS

## 2012-06-16 MED ORDER — SUCCINYLCHOLINE CHLORIDE 20 MG/ML IJ SOLN
INTRAMUSCULAR | Status: DC | PRN
  Administered 2012-06-16: 100 mg via INTRAVENOUS

## 2012-06-16 MED ORDER — PROPOFOL 10 MG/ML IV EMUL
INTRAVENOUS | Status: DC | PRN
  Administered 2012-06-16: 200 mg via INTRAVENOUS

## 2012-06-16 MED ORDER — LIP MEDEX EX OINT
1.0000 "application " | TOPICAL_OINTMENT | Freq: Two times a day (BID) | CUTANEOUS | Status: DC
  Administered 2012-06-16 – 2012-06-18 (×4): 1 via TOPICAL
  Filled 2012-06-16: qty 7

## 2012-06-16 MED ORDER — SODIUM CHLORIDE 0.9 % IJ SOLN: 3.0000 mL | INTRAMUSCULAR | Status: DC | PRN

## 2012-06-16 MED ORDER — LACTATED RINGERS IV BOLUS (SEPSIS): 1000.0000 mL | Freq: Three times a day (TID) | INTRAVENOUS | Status: DC | PRN

## 2012-06-16 MED ORDER — ACETAMINOPHEN 500 MG PO TABS
1000.0000 mg | ORAL_TABLET | Freq: Three times a day (TID) | ORAL | Status: DC
  Administered 2012-06-16 – 2012-06-18 (×6): 1000 mg via ORAL
  Filled 2012-06-16 (×8): qty 2

## 2012-06-16 MED ORDER — BUPIVACAINE-EPINEPHRINE 0.25% -1:200000 IJ SOLN
INTRAMUSCULAR | Status: AC
  Filled 2012-06-16: qty 1

## 2012-06-16 MED ORDER — PROMETHAZINE HCL 25 MG/ML IJ SOLN: 12.5000 mg | Freq: Four times a day (QID) | INTRAMUSCULAR | Status: DC | PRN

## 2012-06-16 MED ORDER — NEOSTIGMINE METHYLSULFATE 1 MG/ML IJ SOLN
INTRAMUSCULAR | Status: DC | PRN
  Administered 2012-06-16: 2 mg via INTRAVENOUS

## 2012-06-16 MED ORDER — MEPERIDINE HCL 50 MG/ML IJ SOLN: 6.2500 mg | INTRAMUSCULAR | Status: DC | PRN

## 2012-06-16 MED ORDER — LACTATED RINGERS IV SOLN
INTRAVENOUS | Status: DC
  Administered 2012-06-16: 19:00:00 via INTRAVENOUS
  Administered 2012-06-17: 75 mL via INTRAVENOUS

## 2012-06-16 MED ORDER — PSYLLIUM 95 % PO PACK
1.0000 | PACK | Freq: Two times a day (BID) | ORAL | Status: DC
  Administered 2012-06-16 – 2012-06-18 (×4): 1 via ORAL
  Filled 2012-06-16 (×5): qty 1

## 2012-06-16 MED ORDER — FENTANYL CITRATE 0.05 MG/ML IJ SOLN
INTRAMUSCULAR | Status: DC | PRN
  Administered 2012-06-16: 50 ug via INTRAVENOUS
  Administered 2012-06-16: 100 ug via INTRAVENOUS
  Administered 2012-06-16: 25 ug via INTRAVENOUS
  Administered 2012-06-16 (×3): 50 ug via INTRAVENOUS

## 2012-06-16 MED ORDER — OXYCODONE HCL 5 MG/5ML PO SOLN: 5.0000 mg | Freq: Once | ORAL | Status: DC | PRN

## 2012-06-16 MED ORDER — PROMETHAZINE HCL 25 MG/ML IJ SOLN: 6.2500 mg | INTRAMUSCULAR | Status: DC | PRN

## 2012-06-16 MED ORDER — LACTATED RINGERS IR SOLN
Status: DC | PRN
  Administered 2012-06-16: 1000 mL

## 2012-06-16 MED ORDER — OXYCODONE HCL 5 MG PO TABS: 5.0000 mg | ORAL_TABLET | ORAL | Status: DC | PRN

## 2012-06-16 MED ORDER — BUPIVACAINE-EPINEPHRINE 0.25% -1:200000 IJ SOLN
INTRAMUSCULAR | Status: DC | PRN
  Administered 2012-06-16: 80 mL

## 2012-06-16 MED ORDER — LACTATED RINGERS IV SOLN
INTRAVENOUS | Status: DC
  Administered 2012-06-16: 1000 mL via INTRAVENOUS

## 2012-06-16 MED ORDER — BUPIVACAINE-EPINEPHRINE PF 0.25-1:200000 % IJ SOLN
INTRAMUSCULAR | Status: AC
  Filled 2012-06-16: qty 60

## 2012-06-16 MED ORDER — DEXAMETHASONE SODIUM PHOSPHATE 10 MG/ML IJ SOLN
INTRAMUSCULAR | Status: DC | PRN
  Administered 2012-06-16: 10 mg via INTRAVENOUS

## 2012-06-16 MED ORDER — SODIUM CHLORIDE 0.9 % IV SOLN: 250.0000 mL | INTRAVENOUS | Status: DC | PRN

## 2012-06-16 MED ORDER — GLYCOPYRROLATE 0.2 MG/ML IJ SOLN
INTRAMUSCULAR | Status: DC | PRN
  Administered 2012-06-16: .6 mg via INTRAVENOUS

## 2012-06-16 MED ORDER — ASPIRIN EC 81 MG PO TBEC
81.0000 mg | DELAYED_RELEASE_TABLET | Freq: Every day | ORAL | Status: DC
  Administered 2012-06-16 – 2012-06-18 (×3): 81 mg via ORAL
  Filled 2012-06-16 (×5): qty 1

## 2012-06-16 MED ORDER — HYDROMORPHONE HCL PF 1 MG/ML IJ SOLN
0.5000 mg | INTRAMUSCULAR | Status: DC | PRN
  Administered 2012-06-17: 1 mg via INTRAVENOUS
  Filled 2012-06-16: qty 1

## 2012-06-16 MED ORDER — BUPIVACAINE 0.25 % ON-Q PUMP DUAL CATH 300 ML
300.0000 mL | INJECTION | Status: DC
  Filled 2012-06-16: qty 300

## 2012-06-16 MED ORDER — MIDAZOLAM HCL 5 MG/5ML IJ SOLN
INTRAMUSCULAR | Status: DC | PRN
  Administered 2012-06-16: 1 mg via INTRAVENOUS

## 2012-06-16 MED ORDER — HEPARIN SODIUM (PORCINE) 5000 UNIT/ML IJ SOLN
5000.0000 [IU] | Freq: Three times a day (TID) | INTRAMUSCULAR | Status: DC
  Administered 2012-06-17 – 2012-06-18 (×4): 5000 [IU] via SUBCUTANEOUS
  Filled 2012-06-16 (×7): qty 1

## 2012-06-16 MED ORDER — ONDANSETRON HCL 4 MG/2ML IJ SOLN: 4.0000 mg | Freq: Four times a day (QID) | INTRAMUSCULAR | Status: DC | PRN

## 2012-06-16 MED ORDER — ALUM & MAG HYDROXIDE-SIMETH 200-200-20 MG/5ML PO SUSP: 30.0000 mL | Freq: Four times a day (QID) | ORAL | Status: DC | PRN

## 2012-06-16 MED ORDER — BISACODYL 10 MG RE SUPP: 10.0000 mg | Freq: Two times a day (BID) | RECTAL | Status: DC | PRN

## 2012-06-16 MED ORDER — CEFAZOLIN SODIUM-DEXTROSE 2-3 GM-% IV SOLR
INTRAVENOUS | Status: AC
  Filled 2012-06-16: qty 50

## 2012-06-16 SURGICAL SUPPLY — 48 items
APPLIER CLIP 5 13 M/L LIGAMAX5 (MISCELLANEOUS)
BINDER ABD UNIV 12 45-62 (WOUND CARE) IMPLANT
BINDER ABDOMINAL 46IN 62IN (WOUND CARE)
CANISTER SUCTION 2500CC (MISCELLANEOUS) ×2 IMPLANT
CATH KIT ON Q 7.5IN SLV (PAIN MANAGEMENT) ×4 IMPLANT
CATH KIT ON-Q SILVERSOAK 7.5IN (CATHETERS) IMPLANT
CLIP APPLIE 5 13 M/L LIGAMAX5 (MISCELLANEOUS) IMPLANT
CLOTH BEACON ORANGE TIMEOUT ST (SAFETY) ×2 IMPLANT
DECANTER SPIKE VIAL GLASS SM (MISCELLANEOUS) ×2 IMPLANT
DEVICE SECURE STRAP 25 ABSORB (INSTRUMENTS) ×2 IMPLANT
DEVICE TROCAR PUNCTURE CLOSURE (ENDOMECHANICALS) IMPLANT
DRAPE LAPAROSCOPIC ABDOMINAL (DRAPES) ×2 IMPLANT
DRSG TEGADERM 2-3/8X2-3/4 SM (GAUZE/BANDAGES/DRESSINGS) ×12 IMPLANT
ELECT REM PT RETURN 9FT ADLT (ELECTROSURGICAL) ×2
ELECTRODE REM PT RTRN 9FT ADLT (ELECTROSURGICAL) ×1 IMPLANT
FILTER SMOKE EVAC LAPAROSHD (FILTER) IMPLANT
GAUZE SPONGE 2X2 8PLY STRL LF (GAUZE/BANDAGES/DRESSINGS) ×1 IMPLANT
GLOVE ECLIPSE 8.0 STRL XLNG CF (GLOVE) ×2 IMPLANT
GLOVE INDICATOR 8.0 STRL GRN (GLOVE) ×4 IMPLANT
GOWN STRL NON-REIN LRG LVL3 (GOWN DISPOSABLE) ×2 IMPLANT
GOWN STRL REIN XL XLG (GOWN DISPOSABLE) ×4 IMPLANT
HAND ACTIVATED (MISCELLANEOUS) IMPLANT
KIT BASIN OR (CUSTOM PROCEDURE TRAY) ×2 IMPLANT
MESH PHYSIO OVAL 20X25CM (Mesh General) ×2 IMPLANT
NEEDLE SPNL 22GX3.5 QUINCKE BK (NEEDLE) IMPLANT
NS IRRIG 1000ML POUR BTL (IV SOLUTION) ×2 IMPLANT
PEN SKIN MARKING BROAD (MISCELLANEOUS) ×2 IMPLANT
PENCIL BUTTON HOLSTER BLD 10FT (ELECTRODE) IMPLANT
SCISSORS LAP 5X35 DISP (ENDOMECHANICALS) ×2 IMPLANT
SET IRRIG TUBING LAPAROSCOPIC (IRRIGATION / IRRIGATOR) IMPLANT
SLEEVE ADV FIXATION 5X100MM (TROCAR) ×2 IMPLANT
SLEEVE Z-THREAD 5X100MM (TROCAR) ×4 IMPLANT
SPONGE GAUZE 2X2 STER 10/PKG (GAUZE/BANDAGES/DRESSINGS) ×1
STAPLER VISISTAT 35W (STAPLE) ×2 IMPLANT
STRIP CLOSURE SKIN 1/2X4 (GAUZE/BANDAGES/DRESSINGS) ×6 IMPLANT
SUT MNCRL AB 4-0 PS2 18 (SUTURE) ×2 IMPLANT
SUT PROLENE 1 CT 1 30 (SUTURE) ×14 IMPLANT
SUT VIC AB 2-0 UR6 27 (SUTURE) IMPLANT
SYR 10ML ECCENTRIC (SYRINGE) ×2 IMPLANT
TOWEL OR 17X26 10 PK STRL BLUE (TOWEL DISPOSABLE) ×2 IMPLANT
TRAY FOLEY CATH 14FRSI W/METER (CATHETERS) IMPLANT
TRAY LAP CHOLE (CUSTOM PROCEDURE TRAY) ×2 IMPLANT
TROCAR BLADELESS OPT 5 100 (ENDOMECHANICALS) ×2 IMPLANT
TROCAR XCEL BLADELESS 5X75MML (TROCAR) ×2 IMPLANT
TROCAR Z-THREAD FIOS 11X100 BL (TROCAR) ×2 IMPLANT
TROCAR Z-THREAD FIOS 5X100MM (TROCAR) ×2 IMPLANT
TUBING INSUFFLATION 10FT LAP (TUBING) ×2 IMPLANT
TUNNELER SHEATH ON-Q 16GX12 DP (PAIN MANAGEMENT) ×2 IMPLANT

## 2012-06-16 NOTE — Op Note (Signed)
06/16/2012  1:46 PM  PATIENT:  Krystal Gutierrez  71 y.o. female  Patient Care Team: Provider Not In System as PCP - General Hart Carwin, MD as Consulting Physician (Gastroenterology) Quintella Reichert, MD as Attending Physician (Cardiology)  PRE-OPERATIVE DIAGNOSIS: Incarcerated Ventral Wall hernia  POST-OPERATIVE DIAGNOSIS:  Incarcerated Ventral Wall hernia  PROCEDURE:  Procedure(s): LAPAROSCOPIC VENTRAL HERNIA REPAIR INSERTION OF MESH  SURGEON:  Surgeon(s): [name], MD  ASSISTANT: none   ANESTHESIA:   local and general  EBL:  Total I/O In: 1000 [I.V.:1000] Out: 400 [Urine:400]  Delay start of Pharmacological VTE agent (>24hrs) due to surgical blood loss or risk of bleeding:  no  DRAINS: On-Q catheter rests in SQ   SPECIMEN:  No Specimen  DISPOSITION OF SPECIMEN:  N/A  COUNTS:  YES  PLAN OF CARE: Admit for overnight observation  PATIENT DISPOSITION:  PACU - hemodynamically stable.   INDICATION: Pleasant patient has developed a ventral wall abdominal hernia incarcerated with transverse colon with some discomfort  Recommendation was made for surgical repair:  The anatomy & physiology of the abdominal wall was discussed. The pathophysiology of hernias was discussed. Natural history risks without surgery including progeressive enlargement, pain, incarceration & strangulation was discussed. Contributors to complications such as smoking, obesity, diabetes, prior surgery, etc were discussed.  I feel the risks of no intervention will lead to serious problems that outweigh the operative risks; therefore, I recommended surgery to reduce and repair the hernia. I explained laparoscopic techniques with possible need for an open approach. I noted the probable use of mesh to patch and/or buttress the hernia repair  Risks such as bleeding, infection, abscess, need for further treatment, heart attack, death, and other risks were discussed. I noted a good likelihood this will  help address the problem. Goals of post-operative recovery were discussed as well. Possibility that this will not correct all symptoms was explained. I stressed the importance of low-impact activity, aggressive pain control, avoiding constipation, & not pushing through pain to minimize risk of post-operative chronic pain or injury. Possibility of reherniation especially with smoking, obesity, diabetes, immunosuppression, and other health conditions was discussed. We will work to minimize complications.  An educational handout further explaining the pathology & treatment options was given as well. Questions were answered. The patient expresses understanding & wishes to proceed with surgery.   OR FINDINGS: Transverse colon incarcerated in 6x5cm periumbilical VWH.  25x20cm Physiomesh lays transversely over the defect  DESCRIPTION:   Informed consent was confirmed. The patient underwent general anaesthesia without difficulty. The patient was positioned appropriately. VTE prevention in place. The patient's abdomen was clipped, prepped, & draped in a sterile fashion. Surgical timeout confirmed our plan.  The patient was positioned in reverse Trendelenburg. Abdominal entry was gained using optical entry technique in the left upper abdomen. Entry was clean. I induced carbon dioxide insufflation. Camera inspection revealed no injury. Extra ports were carefully placed under direct laparoscopic visualization.   I could see the hernia in the central abdomen.  I did laparoscopic lysis of adhesions to free the adhesions at the edges & gradually was able to reduce out the greater omentum & mid-transverse colon.  With that, I did expose the entire anterior abdominal wall.  I primarily used cold scissors with some cautery focused for hemostasis.    I made sure hemostasis was good.  Colon was viable & showed no injury.  I mapped out the region using a needle passer.   To ensure that I would  have at least 5 cm radial  coverage outside of the hernia defect, I chose a 25x20cm cm dual sided mesh.  I placed #1 Prolene stitches around its edge about every 5 cm = 16 total.  I rolled the mesh & placed into the peritoneal cavity through the 10 cm port site fascial defect.  I unrolled  the mesh and positioned it appropriately.  I secured the mesh to cover up the hernia defect using a laparoscopic suture passer to pass the tails of the Prolene through the abdominal wall & tagged them with clamps.  I started out in four corners to make sure I had the mesh centered over the hernia defect appropriately, and then proceeded to work in quadrants.  We evacuated CO2 & desufflated the abdomen.  I tied the fascial stitches down.  I reinsufflated the abdomen.  The mesh provided at least 8-10 cm circumferential coverage around the entire hernia defect.   I placed 4 more stitches at the 4 corners around the edge of the hernia to help centrally secure the mesh.  I tacked the edges & central part of the mesh with SecureStrap absorbable tacks.   Hemostasis was excellent.  I closed the fascia port site on the 10 mm port using a 0 Vicryl stitch using laparoscopic intracorporeal suture passer. I then placed On-Q catheter sheaths in the preperitoneal plane under direct laparoscopic guidance from the subxiphoid region down to the posterior iliac crests in the lower flanks. I did reinspection. Hemostasis was good. Mesh laid well. Capnoperitoneum was evacuated. Ports were removed. The skin was closed with Monocryl at the port sites and Steri-Strips on the fascial stitch puncture sites. OnQ catheters were placed and the sheathes peeled away. On-Q pump was secured. Patient is being extubated to go to the recovery room. I'm about discussed operative findings with the patient's family.

## 2012-06-16 NOTE — Progress Notes (Signed)
CPAP mask in car. Daughter able to retreive it when needed

## 2012-06-16 NOTE — Transfer of Care (Signed)
Immediate Anesthesia Transfer of Care Note  Patient: Krystal Gutierrez  Procedure(s) Performed: Procedure(s) (LRB) with comments: LAPAROSCOPIC VENTRAL HERNIA (N/A) - Laparosocopic Ventral Wall incarcerated  Hernia with Mesh, insertion of ON- Q pain pump. INSERTION OF MESH (N/A)  Patient Location: PACU  Anesthesia Type:General  Level of Consciousness: awake, alert , oriented, patient cooperative and responds to stimulation  Airway & Oxygen Therapy: Patient Spontanous Breathing and Patient connected to face mask oxygen  Post-op Assessment: Report given to PACU RN, Post -op Vital signs reviewed and stable and Patient moving all extremities  Post vital signs: Reviewed and stable  Complications: No apparent anesthesia complications

## 2012-06-16 NOTE — H&P (Signed)
Krystal Gutierrez  12/18/1940 [id number]  Patient Care Team: Provider Not In System as PCP - General Hart Carwin, MD as Consulting Physician (Gastroenterology) Quintella Reichert, MD as Attending Physician (Cardiology)  This patient is a 71 y.o.female who presents today for surgical evaluation   Reason for evaluation: Ventral wall hernia.   Pleasant morbidly obese female. [place] her daughter to help take care of her granddaughter. Her daughters been getting her to deficits with cardiology orthopedics and Septra. Had repair of a hip earlier n the year. Had cardiac clearance by Dr. Mayford Knife.   Had a colonoscopy set up. Dr. [name] was unable to complete it secondary to transverse colon going up into a hernia around the bellybutton. CT scan confirmed this. Patient was sent to me for evaluation to see if hernia defect needs to be fixed.  Patient has sleep apnea on CPAP. Stable. Can walk 20 minutes without difficulty. No prior abdominal surgeries. Father lived to 34. Mother lived to 8. Some occasional discomfort after eating but for the most part it does not bother her.   Patient Active Problem List  Diagnosis  . S/P left THA  . Hypertension  . Apnea, sleep  . Diverticulosis  . Ventral hernia  . Obesity, Class III, BMI 40-49.9 (morbid obesity)    Past Medical History  Diagnosis Date  . Hip pain, left   . Hyperlipidemia   . Arthritis   . Ventral hernia     periumbilical  . Edema     bilateral legs  . Diverticulosis   . Apnea, sleep     12/12  SEVERE- report on chart   Dr Mayford Knife follows  . Hypertension     Past Surgical History  Procedure Date  . Total hip arthroplasty 11/10/2011    Procedure: TOTAL HIP ARTHROPLASTY;  Surgeon: Shelda Pal, MD;  Location: [facility];  Service: Orthopedics;  Laterality: Left;    History   Social History  . Marital Status: Married    Spouse Name: N/A    Number of Children: 2  . Years of Education: N/A    Occupational History  . retired    Social History Main Topics  . Smoking status: Never Smoker   . Smokeless tobacco: Never Used  . Alcohol Use: No  . Drug Use: No  . Sexually Active: Not on file   Other Topics Concern  . Not on file   Social History Narrative  . No narrative on file    Family History  Problem Relation Age of Onset  . Diabetes Mother   . Stroke Father   . Arthritis Father     Current Facility-Administered Medications  Medication Dose Route Frequency Provider Last Rate Last Dose  . ceFAZolin (ANCEF) IVPB 2 g/50 mL premix  2 g Intravenous 60 min Pre-Op [name], MD      . lactated ringers infusion   Intravenous Continuous Gaylan Gerold, MD 100 mL/hr at 06/16/12 1008 1,000 mL at 06/16/12 1008   Facility-Administered Medications Ordered in Other Encounters  Medication Dose Route Frequency Provider Last Rate Last Dose  . lactated ringers infusion    Continuous PRN Edison Pace, CRNA         No Known Allergies  BP 172/71  Pulse 54  Temp 97.6 F (36.4 C)  Resp 20  SpO2 99%   .  Review of Systems  Constitutional: Negative for fever, chills, diaphoresis, appetite change and fatigue.  HENT: Negative for ear pain, sore throat, trouble swallowing, neck pain and ear discharge.  Eyes: Negative for photophobia, discharge and visual disturbance.  Respiratory: Negative for cough, choking, chest tightness and shortness of breath.  Cardiovascular: Negative for chest pain and palpitations.  Gastrointestinal: Negative for nausea, vomiting, abdominal pain, diarrhea, constipation, anal bleeding and rectal pain.  Genitourinary: Negative for dysuria, frequency and difficulty urinating.  Musculoskeletal: Negative for myalgias and gait problem.  Skin: Negative for color change, pallor and rash.  Neurological: Negative for dizziness, speech difficulty, weakness and numbness.  Hematological: Negative for adenopathy.  Psychiatric/Behavioral: Negative for  confusion and agitation. The patient is not nervous/anxious.  Objective:   Physical Exam  Constitutional: She is oriented to person, place, and time. She appears well-developed and well-nourished. No distress.  HENT:  Head: Normocephalic.  Mouth/Throat: Oropharynx is clear and moist. No oropharyngeal exudate.  Eyes: Conjunctivae normal and EOM are normal. Pupils are equal, round, and reactive to light. No scleral icterus.  Neck: Normal range of motion. Neck supple. No tracheal deviation present.  Cardiovascular: Normal rate, regular rhythm and intact distal pulses.  Pulmonary/Chest: Effort normal and breath sounds normal. No respiratory distress. She exhibits no tenderness.  Abdominal: Soft. She exhibits no distension, no pulsatile liver and no mass. There is tenderness in the periumbilical area. There is no CVA tenderness. A hernia is present. Hernia confirmed negative in the right inguinal area and confirmed negative in the left inguinal area.    Morbidly obese with moderate panniculus  Genitourinary: No vaginal discharge found.  Musculoskeletal: Normal range of motion. She exhibits edema. She exhibits no tenderness.  Lymphadenopathy:  She has no cervical adenopathy.  Right: No inguinal adenopathy present.  Left: No inguinal adenopathy present.  Neurological: She is alert and oriented to person, place, and time. No cranial nerve deficit. She exhibits normal muscle tone. Coordination normal.  Skin: Skin is warm and dry. No rash noted. She is not diaphoretic. No erythema.  Psychiatric: She has a normal mood and affect. Her behavior is normal. Judgment and thought content normal.  Assessment:   Moderate probable incarcerated periumbilical ventral hernia. 5 x 5 cm at the fascia at the very least. Much larger at the skin.   Plan:    Given the fact that there is a loop of transverse colon incarcerated within this and her parents lived several decades beyond her, I am concerned that she is at  risk for strangulation in a life-threatening issue. I recommend she consider surgery since she is a reasonable operative risk. Morbid obesity does increase the likelihood of recurrence. I suspect she will need an On-Q pump as well as at least overnight stay. I did caution her that pain associated with this can last for a few months at least. Eventually however, it should come down. Her daughter feel strongly she needs surgery.   The patient seems interested in it as well. I discussed the procedure with them:  The anatomy & physiology of the abdominal wall was discussed. The pathophysiology of hernias was discussed. Natural history risks without surgery including progeressive enlargement, pain, incarceration & strangulation was discussed. Contributors to complications such as smoking, obesity, diabetes, prior surgery, etc were discussed.  I feel the risks of no intervention will lead to serious problems that outweigh the operative risks; therefore, I recommended surgery to reduce and repair the hernia. I explained laparoscopic techniques with possible need for an open approach. I noted the probable use of mesh to patch and/or buttress the  hernia repair  Risks such as bleeding, infection, abscess, need for further treatment, heart attack, death, and other risks were discussed. I noted a good likelihood this will help address the problem. Goals of post-operative recovery were discussed as well. Possibility that this will not correct all symptoms was explained. I stressed the importance of low-impact activity, aggressive pain control, avoiding constipation, & not pushing through pain to minimize risk of post-operative chronic pain or injury. Possibility of reherniation especially with smoking, obesity, diabetes, immunosuppression, and other health conditions was discussed. We will work to minimize complications.  An educational handout further explaining the pathology & treatment options was given as well.  Questions were answered. The patient expresses understanding & wishes to proceed with surgery.  I have re-reviewed the the patient's records, history, medications, and allergies.  I have re-examined the patient.  I again discussed intraoperative plans and goals of post-operative recovery.  The patient agrees to proceed.

## 2012-06-16 NOTE — Preoperative (Signed)
Beta Blockers   Reason not to administer Beta Blockers:Not Applicable pt. Took beta blocker this a.m. 

## 2012-06-16 NOTE — Anesthesia Postprocedure Evaluation (Signed)
Anesthesia Post Note  Patient: Krystal Gutierrez  Procedure(s) Performed: Procedure(s) (LRB): LAPAROSCOPIC VENTRAL HERNIA (N/A) INSERTION OF MESH (N/A)  Anesthesia type: General  Patient location: PACU  Post pain: Pain level controlled  Post assessment: Post-op Vital signs reviewed  Last Vitals: BP 122/86  Pulse 54  Temp 36.7 C  Resp 20  SpO2 99%  Post vital signs: Reviewed  Level of consciousness: sedated  Complications: No apparent anesthesia complications

## 2012-06-16 NOTE — Anesthesia Preprocedure Evaluation (Addendum)
Anesthesia Evaluation  Patient identified by MRN, date of birth, ID band Patient awake    Reviewed: Allergy & Precautions, H&P , NPO status , Patient's Chart, lab work & pertinent test results  Airway Mallampati: II TM Distance: >3 FB Neck ROM: Full    Dental  (+) Dental Advisory Given and Teeth Intact   Pulmonary sleep apnea ,  breath sounds clear to auscultation  Pulmonary exam normal       Cardiovascular hypertension, Pt. on medications Rhythm:Regular Rate:Normal  Negative stress test and echo WNL   Neuro/Psych negative neurological ROS  negative psych ROS   GI/Hepatic negative GI ROS, Neg liver ROS,   Endo/Other  Morbid obesity  Renal/GU negative Renal ROS     Musculoskeletal negative musculoskeletal ROS (+)   Abdominal (+) + obese,   Peds  Hematology negative hematology ROS (+)   Anesthesia Other Findings   Reproductive/Obstetrics                         Anesthesia Physical Anesthesia Plan  ASA: III  Anesthesia Plan: General   Post-op Pain Management:    Induction: Intravenous  Airway Management Planned: Oral ETT  Additional Equipment:   Intra-op Plan:   Post-operative Plan: Extubation in OR  Informed Consent: I have reviewed the patients History and Physical, chart, labs and discussed the procedure including the risks, benefits and alternatives for the proposed anesthesia with the patient or authorized representative who has indicated his/her understanding and acceptance.   Dental advisory given  Plan Discussed with: CRNA  Anesthesia Plan Comments:         Anesthesia Quick Evaluation

## 2012-06-17 ENCOUNTER — Encounter (HOSPITAL_COMMUNITY): Payer: Self-pay | Admitting: Surgery

## 2012-06-17 MED ORDER — SODIUM CHLORIDE 0.9 % IJ SOLN: 3.0000 mL | INTRAMUSCULAR | Status: DC | PRN

## 2012-06-17 MED ORDER — HYDROMORPHONE HCL PF 1 MG/ML IJ SOLN: 0.5000 mg | INTRAMUSCULAR | Status: DC | PRN

## 2012-06-17 MED ORDER — OXYCODONE HCL 5 MG PO TABS
5.0000 mg | ORAL_TABLET | ORAL | Status: DC | PRN
  Administered 2012-06-17 – 2012-06-18 (×2): 10 mg via ORAL
  Filled 2012-06-17 (×2): qty 2

## 2012-06-17 MED ORDER — METOPROLOL TARTRATE 25 MG PO TABS
25.0000 mg | ORAL_TABLET | Freq: Two times a day (BID) | ORAL | Status: DC
  Administered 2012-06-17 – 2012-06-18 (×2): 25 mg via ORAL
  Filled 2012-06-17 (×3): qty 1

## 2012-06-17 NOTE — Care Management Note (Signed)
    Page 1 of 1   06/17/2012     9:01:29 AM   CARE MANAGEMENT NOTE 06/17/2012  Patient:  Krystal Gutierrez, Krystal Gutierrez   Account Number:  [id number]  Date Initiated:  06/17/2012  Documentation initiated by:  Lorenda Ishihara  Subjective/Objective Assessment:   71 yo female admitted s/p ventral hernia repair. PTA lived at home with dtr.     Action/Plan:   home when stable   Anticipated DC Date:  06/18/2012   Anticipated DC Plan:  HOME/SELF CARE      DC Planning Services  CM consult      Choice offered to / List presented to:             Status of service:  Completed, signed off Medicare Important Message given?  NA - LOS <3 / Initial given by admissions (If response is "NO", the following Medicare IM given date fields will be blank) Date Medicare IM given:  06/14/2012 Date Additional Medicare IM given:    Discharge Disposition:  HOME/SELF CARE  Per UR Regulation:  Reviewed for med. necessity/level of care/duration of stay  If discussed at Long Length of Stay Meetings, dates discussed:    Comments:

## 2012-06-17 NOTE — Progress Notes (Signed)
Krystal Gutierrez [id number] 1940/10/11   Subjective:  Sore Walked in hallways w help Starting to use PO pain meds  Objective:  Vital signs:  Filed Vitals:   06/17/12 0132 06/17/12 0533 06/17/12 1009 06/17/12 1400  BP: 141/72 133/67 129/56 139/72  Pulse: 66 65 61 63  Temp: 98.9 F (37.2 C) 99.4 F (37.4 C) 98.2 F (36.8 C) 98.5 F (36.9 C)  TempSrc: Oral Oral Oral Oral  Resp: 18 18 18 20   Height:      Weight:      SpO2: 96% 97% 91% 95%    Last BM Date: 06/16/12  Intake/Output   Yesterday:  11/21 0701 - 11/22 0700 In: 1940 [P.O.:240; I.V.:1700] Out: 1990 [Urine:1990] This shift:  Total I/O In: 1256.3 [P.O.:120; I.V.:1136.3] Out: 300 [Urine:300]  Bowel function:  Flatus: y  BM: n  Physical Exam:  General: Pt awake/alert/oriented x4 in no acute distress Eyes: PERRL, normal EOM.  Sclera clear.  No icterus Neuro: CN II-XII intact w/o focal sensory/motor deficits. Lymph: No head/neck/groin lymphadenopathy Psych:  No delerium/psychosis/paranoia HENT: Normocephalic, Mucus membranes moist.  No thrush Neck: Supple, No tracheal deviation Chest: No chest wall pain w good excursion CV:  Pulses intact.  Regular rhythm MS: Normal AROM mjr joints.  No obvious deformity Abdomen: Soft.  Morbidly obese.  Nondistended.  Mod tender at incisions only.  No incarcerated hernias. Ext:  SCDs BLE.  No mjr edema.  No cyanosis Skin: No petechiae / purpurae  Problem List:  Active Problems:  Apnea, sleep  Incarcerated ventral hernia  Obesity, Class III, BMI 40-49.9 (morbid obesity)   Assessment  Krystal Gutierrez  71 y.o. female  1 Day Post-Op  Procedure(s): LAPAROSCOPIC VENTRAL HERNIA INSERTION OF MESH  Sore   Plan:  -increase pain control -HTN - continue lower doses for now - follow -bowel regimen -VTE prophylaxis- SCDs, etc -mobilize as tolerated to help recovery  Hopefully OK to d/c in AM if continues to improve  [name], M.D., F.A.C.S. Gastrointestinal  and Minimally Invasive Surgery [facility] [address], [address] [place], [place] [address] [phone] Main / Paging [phone] Voice Mail   06/17/2012  CARE TEAM:  PCP: Provider Not In System  Outpatient Care Team: Patient Care Team: Provider Not In System as PCP - General Hart Carwin, MD as Consulting Physician (Gastroenterology) Quintella Reichert, MD as Attending Physician (Cardiology)  Inpatient Treatment Team: Treatment Team: Attending Provider: [name], MD; Respiratory Therapist: Carney Harder, RRT   Results:   Labs: Results for orders placed during the hospital encounter of 06/16/12 (from the past 48 hour(s))  CBC     Status: Abnormal   Collection Time   06/16/12  2:47 PM      Component Value Range Comment   WBC 13.1 (*) 4.0 - 10.5 K/uL    RBC 5.06  3.87 - 5.11 MIL/uL    Hemoglobin 13.2  12.0 - 15.0 g/dL    HCT 40.1  02.7 - 25.3 %    MCV 82.8  78.0 - 100.0 fL    MCH 26.1  26.0 - 34.0 pg    MCHC 31.5  30.0 - 36.0 g/dL    RDW 66.4 (*) 40.3 - 15.5 %    Platelets 264  150 - 400 K/uL   CREATININE, SERUM     Status: Normal   Collection Time   06/16/12  2:47 PM      Component Value Range Comment   Creatinine,  Ser 0.55  0.50 - 1.10 mg/dL    GFR calc non Af Amer >90  >90 mL/min    GFR calc Af Amer >90  >90 mL/min     Imaging / Studies: No results found.  Medications / Allergies: per chart  Antibiotics: Anti-infectives     Start     Dose/Rate Route Frequency Ordered Stop   06/16/12 0813   ceFAZolin (ANCEF) IVPB 2 g/50 mL premix        2 g 100 mL/hr over 30 Minutes Intravenous 60 min pre-op 06/16/12 0813 06/16/12 1100

## 2012-06-17 NOTE — Discharge Summary (Signed)
Physician Discharge Summary  Patient ID: Krystal Gutierrez MRN: [id number] DOB/AGE: [birth year] 71 y.o.  Admit date: 06/16/2012 Discharge date: 06/18/2012  Admission Diagnoses:  Discharge Diagnoses:  Active Problems:  Apnea, sleep  Incarcerated ventral hernia  Obesity, Class III, BMI 40-49.9 (morbid obesity)   Discharged Condition: good  Hospital Course:   Pt underwent lap reduction & repair of incarcerated VWH using mesh.  Postoperatively, patient mobilized and advanced to a solid diet gradually.  Pain was well-controlled and transitioned off IV medications.    By the time of discharge, the patient was walking well the hallways, eating food well, having flatus.  Pain was-controlled on an oral regimen.  Based on meeting DC criteria and recovering well, I felt it was safe for the patient to be discharged home with close followup.  Instructions were discussed in detail.  They are written as well.     Consults: None  Significant Diagnostic Studies:   Treatments: surgery: Lap VWH repair w mesh  Discharge Exam: Blood pressure 139/72, pulse 63, temperature 98.5 F (36.9 C), temperature source Oral, resp. rate 20, height 5\' 5"  (1.651 m), weight 254 lb 13.6 oz (115.6 kg), SpO2 95.00%.  General: Pt awake/alert/oriented x4 in no acute distress  Eyes: PERRL, normal EOM. Sclera clear. No icterus  Neuro: CN II-XII intact w/o focal sensory/motor deficits.  Lymph: No head/neck/groin lymphadenopathy  Psych: No delerium/psychosis/paranoia  HENT: Normocephalic, Mucus membranes moist. No thrush  Neck: Supple, No tracheal deviation  Chest: No chest wall pain w good excursion  CV: Pulses intact. Regular rhythm  MS: Normal AROM mjr joints. No obvious deformity  Abdomen: Soft. Morbidly obese. Nondistended. Mod tender at incisions only. No incarcerated hernias.  Ext: SCDs BLE. No mjr edema. No cyanosis  Skin: No petechiae / purpurae      Disposition: HOME  Discharge Orders    Future  Appointments: Provider: Department: Dept Phone: Center:   07/05/2012 10:45 AM [name], MD [facility] [phone] None     Future Orders Please Complete By Expires   Diet - low sodium heart healthy      Care order/instruction      Comments:   Teach care of On-Q catheter & how to self-D/C if pt leaves prior to removal   Increase activity slowly          Medication List     As of 06/17/2012  3:40 PM    TAKE these medications         aspirin 81 MG tablet   Take 81 mg by mouth daily.      cholecalciferol 1000 UNITS tablet   Commonly known as: VITAMIN D   Take 1,000 Units by mouth daily.      fish oil-omega-3 fatty acids 1000 MG capsule   Take 1 g by mouth every morning.      metoprolol succinate 100 MG 24 hr tablet   Commonly known as: TOPROL-XL   Take 100 mg by mouth every morning. Take with or immediately following a meal.      NIFEdipine 90 MG 24 hr tablet   Commonly known as: ADALAT CC   Take 90 mg by mouth every morning.      NON FORMULARY   CPAP AT NIGHT      oxyCODONE 5 MG immediate release tablet   Commonly known as: Oxy IR/ROXICODONE   Take 1-2 tablets (5-10 mg total) by mouth every 4 (four) hours as needed for pain.      pravastatin  40 MG tablet   Commonly known as: PRAVACHOL   Take 40 mg by mouth every evening.      PX SENIOR VITAMIN PO   Take 1 tablet by mouth every morning.           Follow-up Information    Follow up with Tavionna Grout C., MD. Schedule an appointment as soon as possible for a visit in 3 weeks.   Contact information:   [address] [place] [address] [phone]          Signed: [name]. 06/17/2012, 3:40 PM

## 2012-06-18 NOTE — Progress Notes (Signed)
Patient ID: Krystal Gutierrez, female   DOB: Dec 31, 1940, 71 y.o.   MRN: [id number] [facility] Progress Note:   2 Days Post-Op  Subjective: Mental status is clear Objective: Vital signs in last 24 hours: Temp:  [98 F (36.7 C)-98.6 F (37 C)] 98 F (36.7 C) (11/23 0555) Pulse Rate:  [61-75] 73  (11/23 0555) Resp:  [16-20] 16  (11/23 0555) BP: (104-139)/(56-72) 104/60 mmHg (11/23 0555) SpO2:  [91 %-96 %] 96 % (11/23 0555)  Intake/Output from previous day: 11/22 0701 - 11/23 0700 In: 1256.3 [P.O.:120; I.V.:1136.3] Out: 2650 [Urine:2650] Intake/Output this shift:    Physical Exam: Work of breathing is normal.  Minimal pain.    Lab Results:  Results for orders placed during the hospital encounter of 06/16/12 (from the past 48 hour(s))  CBC     Status: Abnormal   Collection Time   06/16/12  2:47 PM      Component Value Range Comment   WBC 13.1 (*) 4.0 - 10.5 K/uL    RBC 5.06  3.87 - 5.11 MIL/uL    Hemoglobin 13.2  12.0 - 15.0 g/dL    HCT 78.2  95.6 - 21.3 %    MCV 82.8  78.0 - 100.0 fL    MCH 26.1  26.0 - 34.0 pg    MCHC 31.5  30.0 - 36.0 g/dL    RDW 08.6 (*) 57.8 - 15.5 %    Platelets 264  150 - 400 K/uL   CREATININE, SERUM     Status: Normal   Collection Time   06/16/12  2:47 PM      Component Value Range Comment   Creatinine, Ser 0.55  0.50 - 1.10 mg/dL    GFR calc non Af Amer >90  >90 mL/min    GFR calc Af Amer >90  >90 mL/min     Radiology/Results: No results found.  Anti-infectives: Anti-infectives     Start     Dose/Rate Route Frequency Ordered Stop   06/16/12 0813   ceFAZolin (ANCEF) IVPB 2 g/50 mL premix        2 g 100 mL/hr over 30 Minutes Intravenous 60 min pre-op 06/16/12 0813 06/16/12 1100          Assessment/Plan: Problem List: Patient Active Problem List  Diagnosis  . S/P left THA  . Hypertension  . Apnea, sleep  . Diverticulosis  . Incarcerated ventral hernia  . Obesity, Class III, BMI 40-49.9 (morbid obesity)    Patient  ready for discharge and meds and orders per Dr. Michaell Cowing.  OnQ removed prior to discharge 2 Days Post-Op    LOS: 2 days   Matt B. Daphine Deutscher, MD, [facility] [phone] beeper [phone]  06/18/2012 8:22 AM

## 2012-06-20 ENCOUNTER — Telehealth (INDEPENDENT_AMBULATORY_CARE_PROVIDER_SITE_OTHER): Payer: Self-pay | Admitting: General Surgery

## 2012-06-20 NOTE — Telephone Encounter (Signed)
Pt home from umbilical hernia repair.  Called to ask if okay to not wear the abdominal binder now that surgery is over.  Explained the binder could now be used only as needed for comfort.

## 2012-06-27 ENCOUNTER — Telehealth (INDEPENDENT_AMBULATORY_CARE_PROVIDER_SITE_OTHER): Payer: Self-pay

## 2012-06-27 DIAGNOSIS — G8918 Other acute postprocedural pain: Secondary | ICD-10-CM

## 2012-06-27 MED ORDER — HYDROCODONE-ACETAMINOPHEN 5-325 MG PO TABS: 1.0000 | ORAL_TABLET | Freq: Four times a day (QID) | ORAL | Status: DC | PRN

## 2012-06-27 NOTE — Telephone Encounter (Signed)
Patient called in for pain medicine refill. Patient had ventral hernia repair 11/21. She states she has 3 oxycodone left from initial prescription she was discharged with. I told her I could call in our hydrocodone protocol to her pharmacy which is [facility]. Patient was okay with it.

## 2012-07-05 ENCOUNTER — Encounter (INDEPENDENT_AMBULATORY_CARE_PROVIDER_SITE_OTHER): Payer: Self-pay | Admitting: Surgery

## 2012-07-05 ENCOUNTER — Ambulatory Visit (INDEPENDENT_AMBULATORY_CARE_PROVIDER_SITE_OTHER): Payer: Medicare Other | Admitting: Surgery

## 2012-07-05 VITALS — BP 136/70 | HR 64 | Resp 20 | Ht 64.0 in | Wt 263.0 lb

## 2012-07-05 DIAGNOSIS — K436 Other and unspecified ventral hernia with obstruction, without gangrene: Secondary | ICD-10-CM

## 2012-07-05 DIAGNOSIS — G8918 Other acute postprocedural pain: Secondary | ICD-10-CM

## 2012-07-05 MED ORDER — HYDROCODONE-ACETAMINOPHEN 5-325 MG PO TABS: 1.0000 | ORAL_TABLET | Freq: Four times a day (QID) | ORAL | Status: DC | PRN

## 2012-07-05 NOTE — Progress Notes (Signed)
Subjective:     Patient ID: Krystal Gutierrez, female   DOB: [birth year], 71 y.o.   MRN: [id number]  HPI  Krystal Gutierrez  1941-02-22 [id number]  Patient Care Team: Provider Not In System as PCP - General Hart Carwin, MD as Consulting Physician (Gastroenterology) Quintella Reichert, MD as Attending Physician (Cardiology)  This patient is a 71 y.o.female who presents today for surgical evaluation Status post laparoscopic repair for ventral hernia incarcerated with colon.  The patient comes in today with her daughter.  Feeling well.  Using 1 hydrocodone at night to sleep.  1 Aleve in the morning.  Weaning off pain medicines well.  Walking around at home okay.  Eating well.  Feels some abdominal pull and discomfort mild after eating.  Getting better.  Mild discomfort in left upper quadrant occasionally.  However, feels like she is getting stronger.  No fevers chills or sweats.  Energy level good.  In good spirits.        Patient Active Problem List  Diagnosis  . S/P left THA  . Hypertension  . Apnea, sleep  . Diverticulosis  . Incarcerated ventral hernia s/p lap VWH reapir 06/16/2012  . Obesity, Class III, BMI 40-49.9 (morbid obesity)    Past Medical History  Diagnosis Date  . Hip pain, left   . Hyperlipidemia   . Arthritis   . Ventral hernia     periumbilical  . Edema     bilateral legs  . Diverticulosis   . Apnea, sleep     12/12  SEVERE- report on chart   Dr Mayford Knife follows  . Hypertension     Past Surgical History  Procedure Date  . Total hip arthroplasty 11/10/2011    Procedure: TOTAL HIP ARTHROPLASTY;  Surgeon: Shelda Pal, MD;  Location: [facility];  Service: Orthopedics;  Laterality: Left;  Marland Kitchen Ventral hernia repair 06/16/2012    Procedure: LAPAROSCOPIC VENTRAL HERNIA;  Surgeon: [name], MD;  Location: [facility];  Service: General;  Laterality: N/A;  Laparosocopic Ventral Wall incarcerated  Hernia with Mesh, insertion of ON- Q pain pump.  . Insertion of mesh 06/16/2012     Procedure: INSERTION OF MESH;  Surgeon: [name], MD;  Location: [facility];  Service: General;  Laterality: N/A;    History   Social History  . Marital Status: Married    Spouse Name: N/A    Number of Children: 2  . Years of Education: N/A   Occupational History  . retired    Social History Main Topics  . Smoking status: Never Smoker   . Smokeless tobacco: Never Used  . Alcohol Use: No  . Drug Use: No  . Sexually Active: Not on file   Other Topics Concern  . Not on file   Social History Narrative  . No narrative on file    Family History  Problem Relation Age of Onset  . Diabetes Mother   . Stroke Father   . Arthritis Father     Current Outpatient Prescriptions  Medication Sig Dispense Refill  . aspirin 81 MG tablet Take 81 mg by mouth daily.      . cholecalciferol (VITAMIN D) 1000 UNITS tablet Take 1,000 Units by mouth daily.      . fish oil-omega-3 fatty acids 1000 MG capsule Take 1 g by mouth every morning.       Marland Kitchen HYDROcodone-acetaminophen (NORCO) 5-325 MG per tablet Take 1 tablet by mouth every 6 (six) hours as needed for pain.  30 tablet  0  . metoprolol succinate (TOPROL-XL) 100 MG 24 hr tablet Take 100 mg by mouth every morning. Take with or immediately following a meal.      . Multiple Vitamins-Minerals (PX SENIOR VITAMIN PO) Take 1 tablet by mouth every morning.       Marland Kitchen NIFEdipine (ADALAT CC) 90 MG 24 hr tablet Take 90 mg by mouth every morning.       . NON FORMULARY CPAP AT NIGHT      . pravastatin (PRAVACHOL) 40 MG tablet Take 40 mg by mouth every evening.          No Known Allergies  BP 136/70  Pulse 64  Resp 20  Ht 5\' 4"  (1.626 m)  Wt 263 lb (119.296 kg)  BMI 45.14 kg/m2  No results found.   Review of Systems  Constitutional: Negative for fever, chills and diaphoresis.  HENT: Negative for ear pain, sore throat and trouble swallowing.   Eyes: Negative for photophobia and visual disturbance.  Respiratory: Negative for cough and  choking.   Cardiovascular: Negative for chest pain and palpitations.  Gastrointestinal: Negative for nausea, vomiting, abdominal pain, diarrhea, constipation, anal bleeding and rectal pain.  Genitourinary: Negative for dysuria, frequency and difficulty urinating.  Musculoskeletal: Negative for myalgias and gait problem.  Skin: Negative for color change, pallor and rash.  Neurological: Negative for dizziness, speech difficulty, weakness and numbness.  Hematological: Negative for adenopathy.  Psychiatric/Behavioral: Negative for confusion and agitation. The patient is not nervous/anxious.        Objective:   Physical Exam  Constitutional: She is oriented to person, place, and time. She appears well-developed and well-nourished. No distress.  HENT:  Head: Normocephalic.  Mouth/Throat: Oropharynx is clear and moist. No oropharyngeal exudate.  Eyes: Conjunctivae normal and EOM are normal. Pupils are equal, round, and reactive to light. No scleral icterus.  Neck: Normal range of motion. No tracheal deviation present.  Cardiovascular: Normal rate and intact distal pulses.   Pulmonary/Chest: Effort normal. No respiratory distress. She exhibits no tenderness.  Abdominal: Soft. She exhibits no distension. There is no tenderness. Hernia confirmed negative in the right inguinal area and confirmed negative in the left inguinal area.       Incisions clean with normal healing ridges.  No hernias  Genitourinary: No vaginal discharge found.  Musculoskeletal: Normal range of motion. She exhibits no tenderness.  Lymphadenopathy:       Right: No inguinal adenopathy present.       Left: No inguinal adenopathy present.  Neurological: She is alert and oriented to person, place, and time. No cranial nerve deficit. She exhibits normal muscle tone. Coordination normal.  Skin: Skin is warm and dry. No rash noted. She is not diaphoretic.  Psychiatric: She has a normal mood and affect. Her behavior is normal.        Assessment:     Morbidly obese female S/p repair of large ventral wall hernia incarcerated w colon, recovering well only 1 month out    Plan:     Increase activity as tolerated to regular activity.  Do not push through pain.  Diet as tolerated. Bowel regimen to avoid problems.   Instructions discussed.  I answered many questions concerning the mesh & other topics.  I tried to explain to her that the mesh patches the hernia in the abdominal wall and does not involve the bowel directly.  It should not affect her digestion or eating.  Certainly a high fiber diet and avoiding  constipation is essential to recovery and long-term health.  I noted it was common to have some pulling and discomfort the first few months, but that will gradually fade away.  She seemed reassured.  Her daughter did as well.     Return to clinic p.r.n.  Followup with primary care physician for other health issues as would normally be done.  Questions answered.  The patient expressed understanding and appreciation

## 2012-07-05 NOTE — Patient Instructions (Addendum)
HERNIA REPAIR: POST OP INSTRUCTIONS  1. DIET: Follow a light bland diet the first 24 hours after arrival home, such as soup, liquids, crackers, etc.  Be sure to include lots of fluids daily.  Avoid fast food or heavy meals as your are more likely to get nauseated.  Eat a low fat the next few days after surgery. 2. Take your usually prescribed home medications unless otherwise directed. 3. PAIN CONTROL: a. Pain is best controlled by a usual combination of three different methods TOGETHER: i. Ice/Heat ii. Over the counter pain medication iii. Prescription pain medication b. Most patients will experience some swelling and bruising around the hernia(s) such as the bellybutton, groins, or old incisions.  Ice packs or heating pads (30-60 minutes up to 6 times a day) will help. Use ice for the first few days to help decrease swelling and bruising, then switch to heat to help relax tight/sore spots and speed recovery.  Some people prefer to use ice alone, heat alone, alternating between ice & heat.  Experiment to what works for you.  Swelling and bruising can take several weeks to resolve.   c. It is helpful to take an over-the-counter pain medication regularly for the first few weeks.  Choose one of the following that works best for you: i. Naproxen (Aleve, etc)  Two 220mg tabs twice a day ii. Ibuprofen (Advil, etc) Three 200mg tabs four times a day (every meal & bedtime) iii. Acetaminophen (Tylenol, etc) 325-650mg four times a day (every meal & bedtime) d. A  prescription for pain medication should be given to you upon discharge.  Take your pain medication as prescribed.  i. If you are having problems/concerns with the prescription medicine (does not control pain, nausea, vomiting, rash, itching, etc), please call us [phone] to see if we need to switch you to a different pain medicine that will work better for you and/or control your side effect better. ii. If you need a refill on your pain  medication, please contact your pharmacy.  They will contact our office to request authorization. Prescriptions will not be filled after 5 pm or on week-ends. 4. Avoid getting constipated.  Between the surgery and the pain medications, it is common to experience some constipation.  Increasing fluid intake and taking a fiber supplement (such as Metamucil, Citrucel, FiberCon, MiraLax, etc) 1-2 times a day regularly will usually help prevent this problem from occurring.  A mild laxative (prune juice, Milk of Magnesia, MiraLax, etc) should be taken according to package directions if there are no bowel movements after 48 hours.   5. Wash / shower every day.  You may shower over the dressings as they are waterproof.   6. Remove your waterproof bandages 5 days after surgery.  You may leave the incision open to air.  You may replace a dressing/Band-Aid to cover the incision for comfort if you wish.  Continue to shower over incision(s) after the dressing is off.    7. ACTIVITIES as tolerated:   a. You may resume regular (light) daily activities beginning the next day-such as daily self-care, walking, climbing stairs-gradually increasing activities as tolerated.  If you can walk 30 minutes without difficulty, it is safe to try more intense activity such as jogging, treadmill, bicycling, low-impact aerobics, swimming, etc. b. Save the most intensive and strenuous activity for last such as sit-ups, heavy lifting, contact sports, etc  Refrain from any heavy lifting or straining until you are off narcotics for pain control.     c. DO NOT PUSH THROUGH PAIN.  Let pain be your guide: If it hurts to do something, don't do it.  Pain is your body warning you to avoid that activity for another week until the pain goes down. d. You may drive when you are no longer taking prescription pain medication, you can comfortably wear a seatbelt, and you can safely maneuver your car and apply brakes. e. You may have sexual intercourse  when it is comfortable.  8. FOLLOW UP in our office a. Please call [facility] at [phone] to set up an appointment to see your surgeon in the office for a follow-up appointment approximately 2-3 weeks after your surgery. b. Make sure that you call for this appointment the day you arrive home to insure a convenient appointment time. 9.  IF YOU HAVE DISABILITY OR FAMILY LEAVE FORMS, BRING THEM TO THE OFFICE FOR PROCESSING.  DO NOT GIVE THEM TO YOUR DOCTOR.  WHEN TO CALL US [phone]: 1. Poor pain control 2. Reactions / problems with new medications (rash/itching, nausea, etc)  3. Fever over 101.5 F (38.5 C) 4. Inability to urinate 5. Nausea and/or vomiting 6. Worsening swelling or bruising 7. Continued bleeding from incision. 8. Increased pain, redness, or drainage from the incision   The clinic staff is available to answer your questions during regular business hours (8:30am-5pm).  Please don't hesitate to call and ask to speak to one of our nurses for clinical concerns.   If you have a medical emergency, go to the nearest emergency room or call 911.  A surgeon from [facility] is always on call at the hospitals in [place]  [facility] [address], [address], [place], [place]  [address] ?  [address] [address], ,    [address] MAIN: [phone] ? TOLL FREE: [phone] ? FAX: [phone] [email or web address]  Managing Pain  Pain after surgery or related to activity is often due to strain/injury to muscle, tendon, nerves and/or incisions.  This pain is usually short-term and will improve in a few months.   Many people find it helpful to do the following things TOGETHER to help speed the process of healing and to get back to regular activity more quickly:  1. Avoid heavy physical activity a.  no lifting greater than 20 pounds b. Do not "push through" the pain.  Listen to your body and avoid positions and maneuvers than  reproduce the pain c. Walking is okay as tolerated, but go slowly and stop when getting sore.  d. Remember: If it hurts to do it, then don't do it! 2. Take Anti-inflammatory medication  a. Take with food/snack around the clock for 1-2 weeks i. This helps the muscle and nerve tissues become less irritable and calm down faster b. Choose ONE of the following over-the-counter medications: i. Naproxen 220mg tabs (ex. Aleve) 1-2 pills twice a day  ii. Ibuprofen 200mg tabs (ex. Advil, Motrin) 3-4 pills with every meal and just before bedtime iii. Acetaminophen 500mg tabs (Tylenol) 1-2 pills with every meal and just before bedtime 3. Use a Heating pad or Ice/Cold Pack a. 4-6 times a day b. May use warm bath/hottub  or showers 4. Try Gentle Massage and/or Stretching  a. at the area of pain many times a day b. stop if you feel pain - do not overdo it  Try these steps together to help you body heal faster and avoid making things get worse.  Doing just one of these   things may not be enough.    If you are not getting better after two weeks or are noticing you are getting worse, contact our office for further advice; we may need to re-evaluate you & see what other things we can do to help.   

## 2012-09-10 ENCOUNTER — Other Ambulatory Visit: Payer: Self-pay

## 2012-10-20 IMAGING — CR DG PORTABLE PELVIS
1 series · 2 of 2 positions shown · non-contrast
Comparison: 09/22/2011

CLINICAL DATA: Left total hip replacement

PORTABLE PELVIS

[Series 1: AP · U · 2 of 2 slices shown]
[im 1/2]
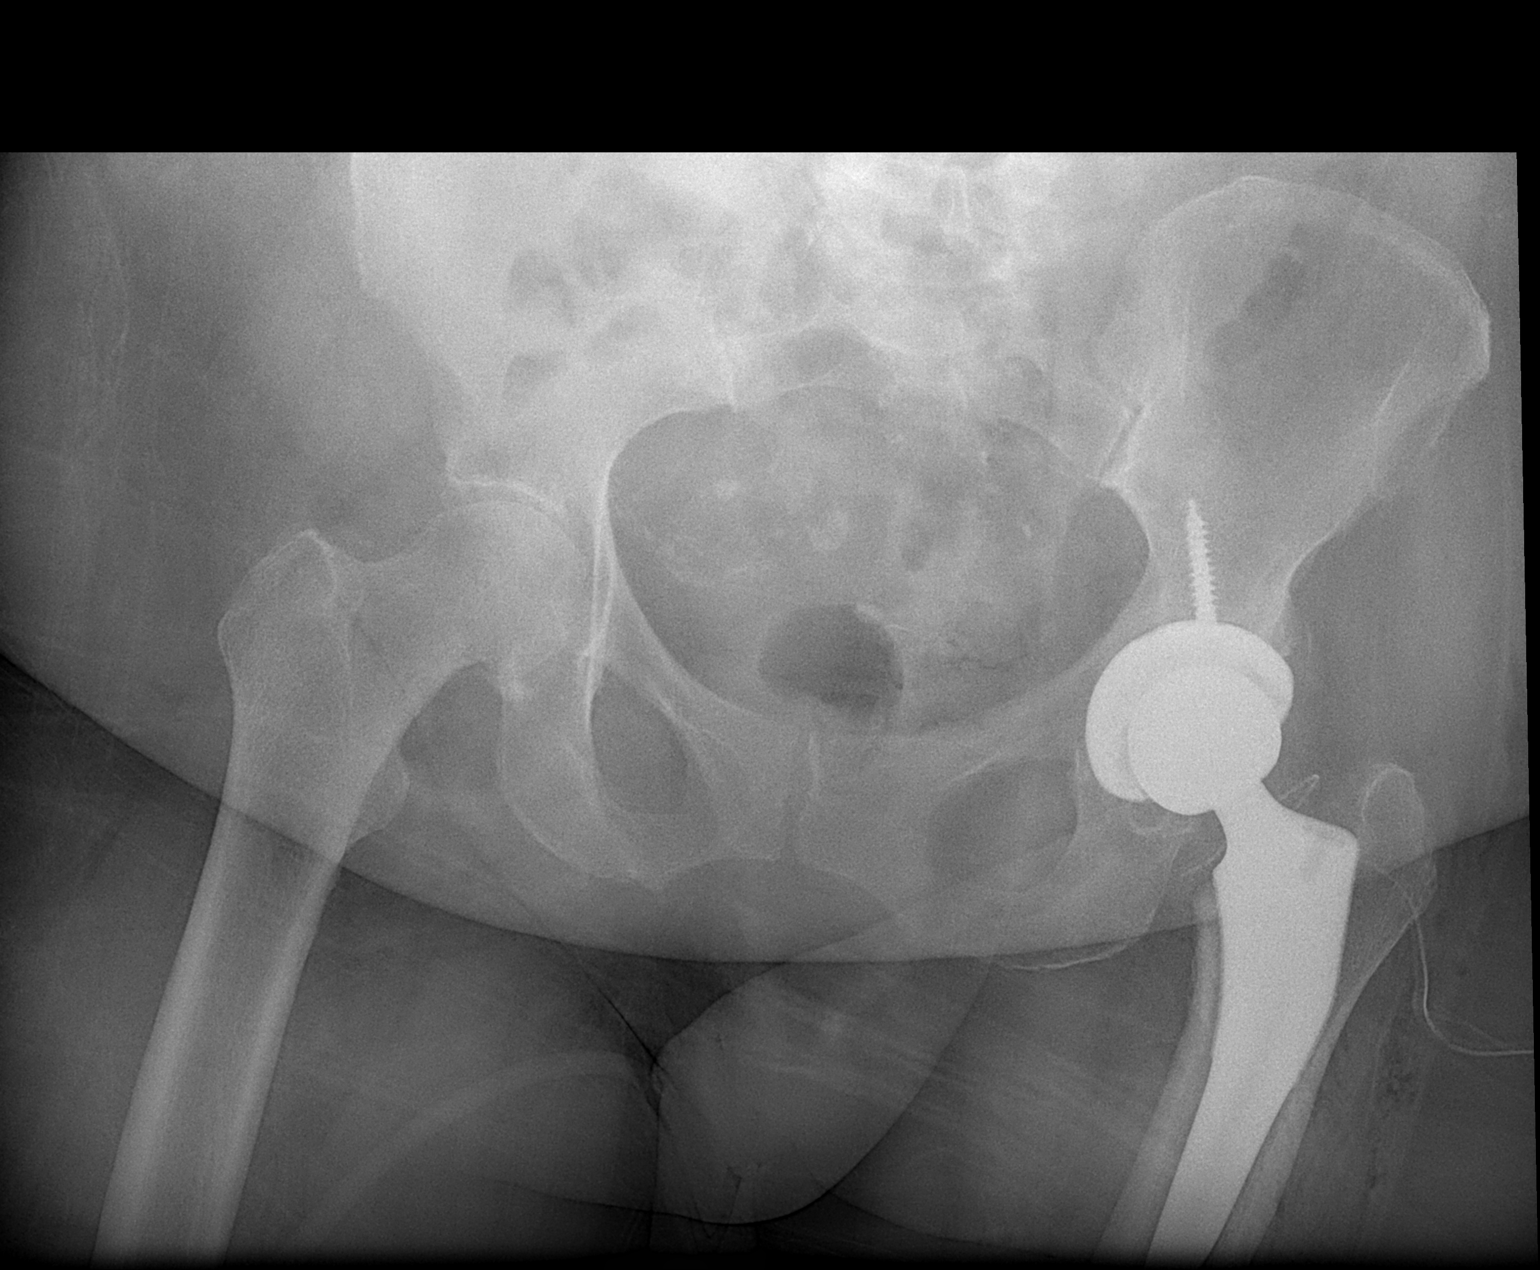
[im 2/2]
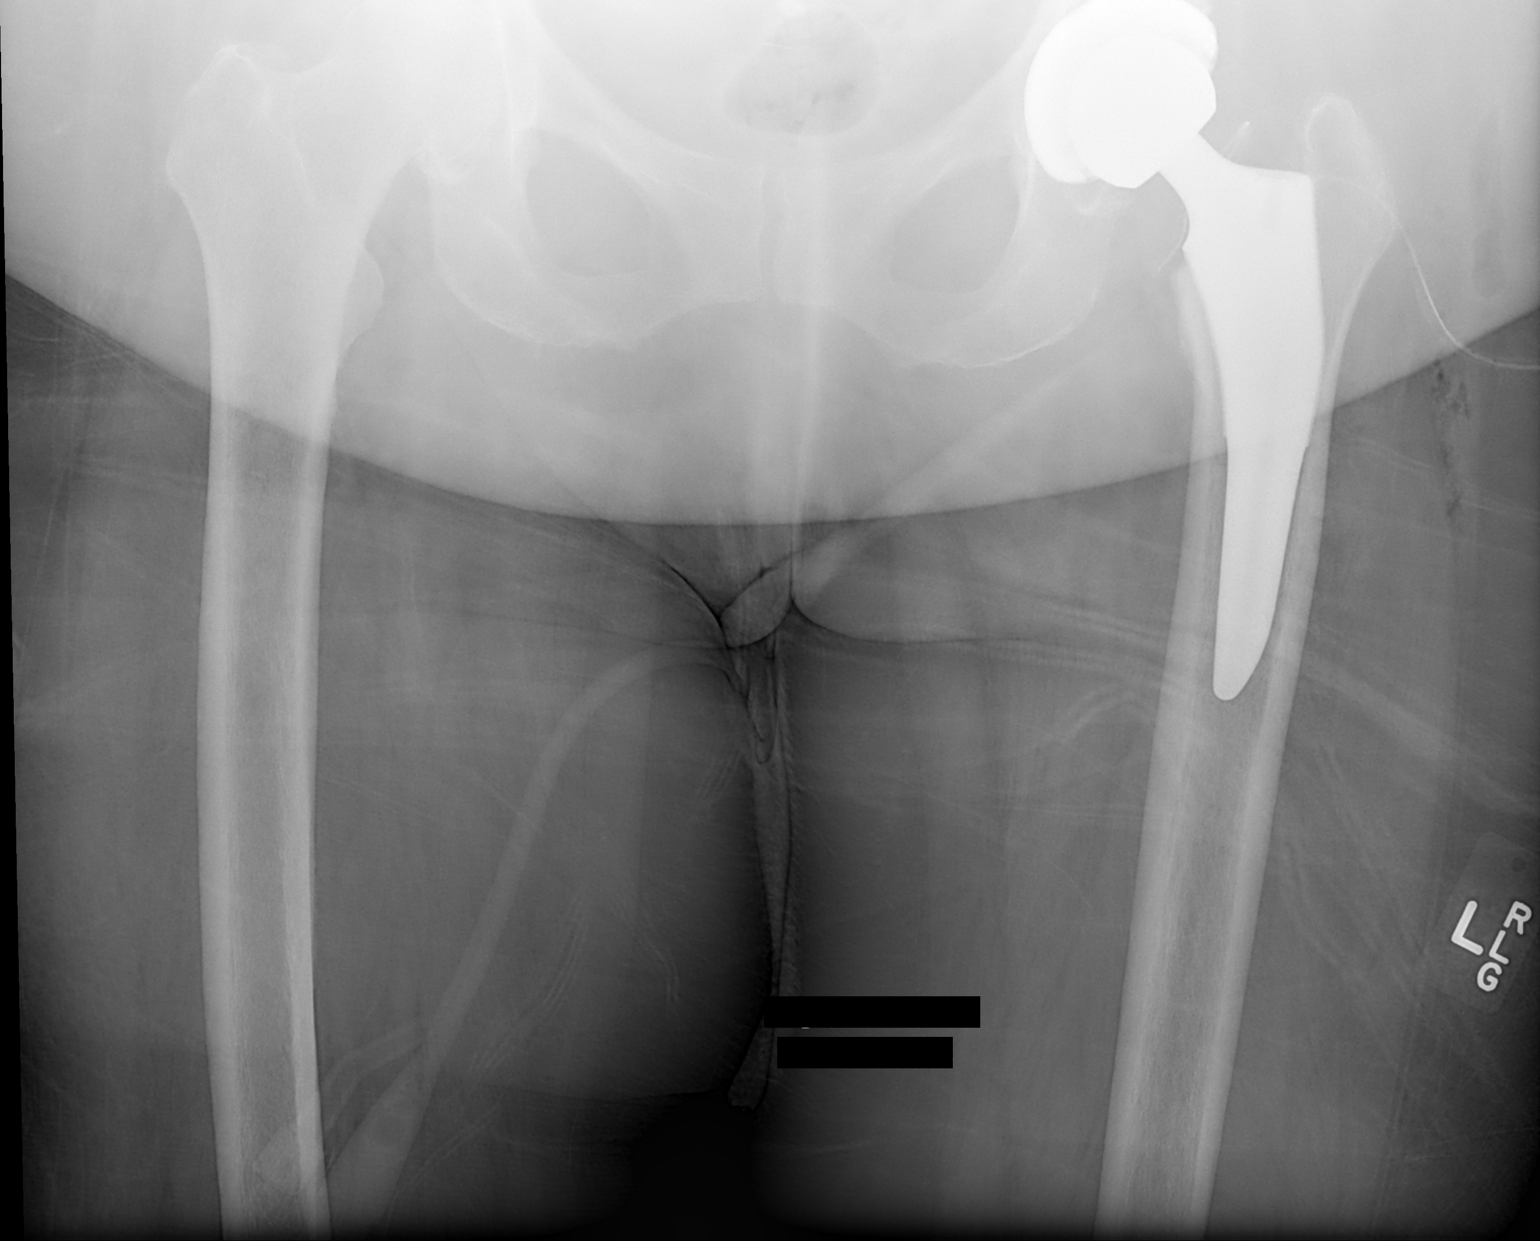

[2 of 2 positions shown; findings below may reference images not displayed]

FINDINGS: Left total hip arthroplasty has been performed.

The hardware components are in anatomic alignment.

No periprosthetic fracture or subluxation.

No radio-opaque foreign bodies or soft tissue calcifications.
IMPRESSION: 1.  Status post left total hip arthroplasty.
2.  No complicating features identified

## 2013-03-01 ENCOUNTER — Other Ambulatory Visit: Payer: Self-pay

## 2013-06-01 ENCOUNTER — Other Ambulatory Visit: Payer: Self-pay

## 2013-09-10 ENCOUNTER — Encounter: Payer: Self-pay | Admitting: *Deleted

## 2013-10-17 ENCOUNTER — Ambulatory Visit (INDEPENDENT_AMBULATORY_CARE_PROVIDER_SITE_OTHER): Payer: Medicare Other | Admitting: Cardiology

## 2013-10-17 ENCOUNTER — Encounter: Payer: Self-pay | Admitting: Cardiology

## 2013-10-17 VITALS — BP 158/64 | HR 59 | Ht 64.0 in | Wt 265.0 lb

## 2013-10-17 DIAGNOSIS — I10 Essential (primary) hypertension: Secondary | ICD-10-CM

## 2013-10-17 DIAGNOSIS — E66813 Obesity, class 3: Secondary | ICD-10-CM

## 2013-10-17 DIAGNOSIS — G4733 Obstructive sleep apnea (adult) (pediatric): Secondary | ICD-10-CM

## 2013-10-17 NOTE — Patient Instructions (Signed)
Your physician recommends that you continue on your current medications as directed. Please refer to the Current Medication list given to you today.  Your physician wants you to follow-up in: 6 months with Dr Turner You will receive a reminder letter in the mail two months in advance. If you don't receive a letter, please call our office to schedule the follow-up appointment.  

## 2013-10-17 NOTE — Progress Notes (Signed)
[address] [place], [place]  [address] Phone: [phone] Fax:  [phone]  Date:  10/17/2013   ID:  Krystal Gutierrez, DOB 07/06/1941, MRN [id number]  PCP:  PROVIDER NOT IN SYSTEM  Sleep Medicine:  Armanda Magic, MD   History of Present Illness: Krystal Gutierrez is a 73 y.o. female with a history of OSA, obesity and HTN.  She is doing well.  She tolerates her CPAP device.  She tolerates the nasal pillow mask and feels the pressure is adequate.  She feels rested in the am and has no daytime sleepiness.  She does senior aerobics for exercise.   Wt Readings from Last 3 Encounters:  07/05/12 263 lb (119.296 kg)  06/16/12 254 lb 13.6 oz (115.6 kg)  06/16/12 254 lb 13.6 oz (115.6 kg)     Past Medical History  Diagnosis Date  . Hip pain, left   . Arthritis   . Ventral hernia     periumbilical  . Edema     bilateral legs  . Diverticulosis   . OSA (obstructive sleep apnea)     Severe OSA with AHI 54/hr now on VPAP SV. Download on 04/25/13 showed an AHI of 1.0/hr & 100% compliance in using more than 4 hours nightly. Tolerates mask and pressure well.   . Hyperlipidemia   . Obesity (BMI 30-39.9)   . Essential hypertension, benign     Current Outpatient Prescriptions  Medication Sig Dispense Refill  . aspirin EC 81 MG tablet Take 81 mg by mouth daily. STOPPED FOR PROCEDURE      . Cholecalciferol (VITAMIN D) 2000 UNITS tablet Take 2,000 Units by mouth daily.      . fish oil-omega-3 fatty acids 1000 MG capsule Take 1 g by mouth every morning.       Marland Kitchen HYDROcodone-acetaminophen (NORCO/VICODIN) 5-325 MG per tablet Take 1 tablet by mouth every 6 (six) hours as needed for pain.      . metoprolol (LOPRESSOR) 100 MG tablet Take 100 mg by mouth daily.      . Multiple Vitamins-Minerals (PX SENIOR VITAMIN PO) Take 1 tablet by mouth every morning.       . naproxen (NAPROSYN) 500 MG tablet Take 500 mg by mouth as needed.      Marland Kitchen NIFEdipine (ADALAT CC) 90 MG 24 hr tablet Take 90 mg by mouth  every morning.       . NON FORMULARY CPAP VPAP AT NIGHT      . pravastatin (PRAVACHOL) 40 MG tablet Take 40 mg by mouth every evening.        No current facility-administered medications for this visit.    Allergies:   No Known Allergies  Social History:  The patient  reports that she has never smoked. She has never used smokeless tobacco. She reports that she does not drink alcohol or use illicit drugs.   Family History:  The patient's family history includes Arthritis in her father; Diabetes in her mother; Hypertension in her father; Stroke in her father.   ROS:  Please see the history of present illness.      All other systems reviewed and negative.   PHYSICAL EXAM: VS:  There were no vitals taken for this visit. Well nourished, well developed, in no acute distress HEENT: normal Neck: no JVD Cardiac:  normal S1, S2; RRR; no murmur Lungs:  clear to auscultation bilaterally, no wheezing, rhonchi or rales Abd: soft, nontender, no hepatomegaly Ext: no edema Skin: warm and dry Neuro:  CNs 2-12 intact, no focal abnormalities noted       ASSESSMENT AND PLAN:  1. OSA on CPAP and tolerating well.  Her download today showed an AHI of 1.1/hr on 12cm H2o and 99% compliance in using more than 4 hours nightly.  She will continue on current settings. 2. Obesity 3. HTN - mildly elevated but at home it is normal per her daughter who is a Engineer, civil (consulting)nurse - continue metoprolol/Nifedipine  Followup with me in 6 months  Signed, Armanda Magicraci Marcellas Marchant, MD 10/17/2013 9:07 AM

## 2013-11-08 ENCOUNTER — Encounter: Payer: Self-pay | Admitting: Cardiology

## 2014-04-17 ENCOUNTER — Ambulatory Visit (INDEPENDENT_AMBULATORY_CARE_PROVIDER_SITE_OTHER): Payer: Medicare Other | Admitting: Cardiology

## 2014-04-17 ENCOUNTER — Encounter: Payer: Self-pay | Admitting: Cardiology

## 2014-04-17 VITALS — BP 121/66 | HR 60 | Ht 64.0 in | Wt 255.8 lb

## 2014-04-17 DIAGNOSIS — I10 Essential (primary) hypertension: Secondary | ICD-10-CM

## 2014-04-17 DIAGNOSIS — G4733 Obstructive sleep apnea (adult) (pediatric): Secondary | ICD-10-CM

## 2014-04-17 NOTE — Progress Notes (Signed)
[address] [place], [place]  [address] Phone: [phone] Fax:  [phone]  Date:  04/17/2014   ID:  Krystal Gutierrez, DOB 1941/03/18, MRN [id number]  PCP:  PROVIDER NOT IN SYSTEM  Cardiologist:  Armanda Magic, MD    History of Present Illness: Krystal Gutierrez is a 73 y.o. female with a history of OSA, obesity and HTN. She is doing well. She tolerates her CPAP device. She tolerates the nasal pillow mask and feels the pressure is adequate. She feels rested in the am and has no daytime sleepiness. She does senior aerobics for exercise.    Wt Readings from Last 3 Encounters:  04/17/14 255 lb 12.8 oz (116.03 kg)  10/17/13 265 lb (120.203 kg)  07/05/12 263 lb (119.296 kg)     Past Medical History  Diagnosis Date  . Hip pain, left   . Arthritis   . Ventral hernia     periumbilical  . Edema     bilateral legs  . Diverticulosis   . OSA (obstructive sleep apnea)     Severe OSA with AHI 54/hr now on VPAP SV. Download on 04/25/13 showed an AHI of 1.0/hr & 100% compliance in using more than 4 hours nightly. Tolerates mask and pressure well.   . Hyperlipidemia   . Obesity (BMI 30-39.9)   . Essential hypertension, benign     Current Outpatient Prescriptions  Medication Sig Dispense Refill  . aspirin EC 81 MG tablet Take 81 mg by mouth daily. STOPPED FOR PROCEDURE      . Cholecalciferol (VITAMIN D) 2000 UNITS tablet Take 2,000 Units by mouth daily.      . fish oil-omega-3 fatty acids 1000 MG capsule Take 1 g by mouth every morning.       . metoprolol (LOPRESSOR) 100 MG tablet Take 100 mg by mouth daily.      . Multiple Vitamins-Minerals (PX SENIOR VITAMIN PO) Take 1 tablet by mouth every morning.       . naproxen (NAPROSYN) 500 MG tablet Take 500 mg by mouth as needed.      Marland Kitchen NIFEdipine (ADALAT CC) 90 MG 24 hr tablet Take 90 mg by mouth every morning.       . NON FORMULARY CPAP VPAP AT NIGHT      . pravastatin (PRAVACHOL) 40 MG tablet Take 40 mg by mouth every evening.          No current facility-administered medications for this visit.    Allergies:   No Known Allergies  Social History:  The patient  reports that she has never smoked. She has never used smokeless tobacco. She reports that she does not drink alcohol or use illicit drugs.   Family History:  The patient's family history includes Arthritis in her father; Diabetes in her mother; Hypertension in her father; Stroke in her father.   ROS:  Please see the history of present illness.      All other systems reviewed and negative.   PHYSICAL EXAM: VS:  BP 121/66  Pulse 60  Ht  (1.626 m)  Wt 255 lb 12.8 oz (116.03 kg)  BMI 43.89 kg/m2 Well nourished, well developed, in no acute distress HEENT: normal Neck: no JVD Cardiac:  normal S1, S2; RRR; no murmur Lungs:  clear to auscultation bilaterally, no wheezing, rhonchi or rales Abd: soft, nontender, no hepatomegaly Ext: no edema Skin: warm and dry Neuro:  CNs 2-12 intact, no focal abnormalities noted    ASSESSMENT AND PLAN:  1. OSA on CPAP and tolerating well. Her download today showed an AHI of 2/hr on 12cm H2o and 100% compliance in using more than 4 hours nightly. She will continue on current settings. 2. Obesity - I have encouraged her to continue her exercise 3. HTN - mildly elevated but at home it is normal per her daughter who is a Engineer, civil (consulting) - continue metoprolol/Nifedipine   Followup with me in 6 months    Signed, Armanda Magic, MD [facility] 04/17/2014 8:55 AM

## 2014-04-17 NOTE — Patient Instructions (Signed)
Your physician recommends that you continue on your current medications as directed. Please refer to the Current Medication list given to you today.   Continue current CPAP/BiPAP Settings.  Your physician wants you to follow-up in: 6 months with Dr Turner You will receive a reminder letter in the mail two months in advance. If you don't receive a letter, please call our office to schedule the follow-up appointment.  

## 2014-04-18 ENCOUNTER — Encounter: Payer: Self-pay | Admitting: Cardiology

## 2014-10-15 NOTE — Progress Notes (Signed)
Cardiology Office Note   Date:  10/16/2014   ID:  Krystal Gutierrez, DOB 12/17/1940, MRN [id number]  PCP:  PROVIDER NOT IN SYSTEM  Cardiologist:   Quintella ReichertURNER,Krystal Gutierrez   Chief Complaint  Patient presents with  . Sleep Apnea  . Hypertension  . Obesity      History of Present Illness: Krystal Gutierrez is a 74 y.o. female with a history of OSA, obesity and HTN. She is doing well. She tolerates her CPAP device. She tolerates the nasal pillow mask and feels the pressure is adequate. She feels rested in the am and has no daytime sleepiness. She does senior aerobics for exercise.    Past Medical History  Diagnosis Date  . Hip pain, left   . Arthritis   . Ventral hernia     periumbilical  . Edema     bilateral legs  . Diverticulosis   . OSA (obstructive sleep apnea)     Severe OSA with AHI 54/hr now on VPAP SV. Download on 04/25/13 showed an AHI of 1.0/hr & 100% compliance in using more than 4 hours nightly. Tolerates mask and pressure well.   . Hyperlipidemia   . Obesity (BMI 30-39.9)   . Essential hypertension, benign     Past Surgical History  Procedure Laterality Date  . Total hip arthroplasty  11/10/2011    Procedure: TOTAL HIP ARTHROPLASTY;  Surgeon: Shelda PalMatthew D Olin, Gutierrez;  Location: [facility];  Service: Orthopedics;  Laterality: Left;  Marland Kitchen. Ventral hernia repair  06/16/2012    Procedure: LAPAROSCOPIC VENTRAL HERNIA;  Surgeon: Ardeth SportsmanSteven C. Gross, Gutierrez;  Location: [facility];  Service: General;  Laterality: N/A;  Laparosocopic Ventral Wall incarcerated  Hernia with Mesh, insertion of ON- Q pain pump.  . Insertion of mesh  06/16/2012    Procedure: INSERTION OF MESH;  Surgeon: Ardeth SportsmanSteven C. Gross, Gutierrez;  Location: [facility];  Service: General;  Laterality: N/A;     Current Outpatient Prescriptions  Medication Sig Dispense Refill  . aspirin EC 81 MG tablet Take 81 mg by mouth daily. STOPPED FOR PROCEDURE    . Cholecalciferol (VITAMIN D) 2000 UNITS tablet Take 2,000 Units by mouth daily.    . fish  oil-omega-3 fatty acids 1000 MG capsule Take 1 g by mouth every morning.     . metoprolol (LOPRESSOR) 100 MG tablet Take 100 mg by mouth daily.    . Multiple Vitamins-Minerals (PX SENIOR VITAMIN PO) Take 1 tablet by mouth every morning.     Marland Kitchen. NIFEdipine (ADALAT CC) 90 MG 24 hr tablet Take 90 mg by mouth every morning.     . NON FORMULARY CPAP VPAP AT NIGHT    . pravastatin (PRAVACHOL) 40 MG tablet Take 40 mg by mouth every evening.      No current facility-administered medications for this visit.    Allergies:   Review of patient's allergies indicates no known allergies.    Social History:  The patient  reports that she has never smoked. She has never used smokeless tobacco. She reports that she does not drink alcohol or use illicit drugs.   Family History:  The patient's family history includes Arthritis in her father; Diabetes in her mother; Hypertension in her father; Stroke in her father.    ROS:  Please see the history of present illness.   Otherwise, review of systems are positive for none.   All other systems are reviewed and negative.    PHYSICAL EXAM: VS:  BP 120/72 mmHg  Pulse  63  Ht  (1.651 m)  Wt 261 lb 12.8 oz (118.752 kg)  BMI 43.57 kg/m2  SpO2 97% , BMI Body mass index is 43.57 kg/(m^2). GEN: Well nourished, well developed, in no acute distress HEENT: normal Neck: no JVD, carotid bruits, or masses Cardiac: RRR; no murmurs, rubs, or gallops,no edema  Respiratory:  clear to auscultation bilaterally, normal work of breathing GI: soft, nontender, nondistended, + BS MS: no deformity or atrophy Skin: warm and dry, no rash Neuro:  Strength and sensation are intact Psych: euthymic mood, full affect   EKG:  EKG is not ordered today.    Recent Labs: No results found for requested labs within last 365 days.    Lipid Panel No results found for: CHOL, TRIG, HDL, CHOLHDL, VLDL, LDLCALC, LDLDIRECT    Wt Readings from Last 3 Encounters:  10/16/14 261 lb 12.8  oz (118.752 kg)  04/17/14 255 lb 12.8 oz (116.03 kg)  10/17/13 265 lb (120.203 kg)     ASSESSMENT AND PLAN:  1. OSA on CPAP and tolerating well. Her download today showed an AHI of 2.9/hr on 12cm H2o and 100% compliance in using more than 4 hours nightly. She will continue on current settings. 2. Obesity - I have encouraged her to continue her exercise.  She has been working on her diet and her last A1C score was down.         3.   HTN - controlled - continue metoprolol/Nifedipine    Current medicines are reviewed at length with the patient today.  The patient does not have concerns regarding medicines.  The following changes have been made:  no change  Labs/ tests ordered today include: None  No orders of the defined types were placed in this encounter.     Disposition:   FU with me in 6 months   Signed, Quintella Reichert, Gutierrez  10/16/2014 10:23 AM    [facility] [address] [place], [place], [place]  [address] Phone: [phone]; Fax: [phone]

## 2014-10-16 ENCOUNTER — Ambulatory Visit (INDEPENDENT_AMBULATORY_CARE_PROVIDER_SITE_OTHER): Payer: Medicare Other | Admitting: Cardiology

## 2014-10-16 ENCOUNTER — Encounter: Payer: Self-pay | Admitting: Cardiology

## 2014-10-16 VITALS — BP 120/72 | HR 63 | Ht 65.0 in | Wt 261.8 lb

## 2014-10-16 DIAGNOSIS — G4733 Obstructive sleep apnea (adult) (pediatric): Secondary | ICD-10-CM

## 2014-10-16 DIAGNOSIS — I10 Essential (primary) hypertension: Secondary | ICD-10-CM

## 2014-10-16 NOTE — Patient Instructions (Signed)
Your physician recommends that you continue on your current medications as directed. Please refer to the Current Medication list given to you today.  Your physician wants you to follow-up in: 6 months with Dr Turner You will receive a reminder letter in the mail two months in advance. If you don't receive a letter, please call our office to schedule the follow-up appointment.  

## 2014-11-06 ENCOUNTER — Encounter: Payer: Self-pay | Admitting: Cardiology

## 2015-01-31 ENCOUNTER — Encounter: Payer: Self-pay | Admitting: Cardiology

## 2015-04-01 NOTE — Progress Notes (Signed)
Cardiology Office Note   Date:  04/02/2015   ID:  Yariah Selvey, DOB 15-Mar-1941, MRN [id number]  PCP:  PROVIDER NOT IN SYSTEM    Chief Complaint  Patient presents with  . OSA      History of Present Illness: Krystal Gutierrez is a 74 y.o. female with a history of OSA, obesity and HTN. She is doing well. She tolerates her CPAP device. She tolerates the nasal pillow mask and feels the pressure is adequate. She denies any mouth dryness or nasal congestion.  Patient has been using and benefiting from CPAP use and will continue to benefit from therapy. She feels rested in the am and has no daytime sleepiness. She does senior aerobics for exercise.     Past Medical History  Diagnosis Date  . Hip pain, left   . Arthritis   . Ventral hernia     periumbilical  . Edema     bilateral legs  . Diverticulosis   . OSA (obstructive sleep apnea)     Severe OSA with AHI 54/hr now on VPAP SV. Download on 04/25/13 showed an AHI of 1.0/hr & 100% compliance in using more than 4 hours nightly. Tolerates mask and pressure well.   . Hyperlipidemia   . Obesity (BMI 30-39.9)   . Essential hypertension, benign     Past Surgical History  Procedure Laterality Date  . Total hip arthroplasty  11/10/2011    Procedure: TOTAL HIP ARTHROPLASTY;  Surgeon: Shelda Pal, MD;  Location: [facility];  Service: Orthopedics;  Laterality: Left;  Marland Kitchen Ventral hernia repair  06/16/2012    Procedure: LAPAROSCOPIC VENTRAL HERNIA;  Surgeon: [name], MD;  Location: [facility];  Service: General;  Laterality: N/A;  Laparosocopic Ventral Wall incarcerated  Hernia with Mesh, insertion of ON- Q pain pump.  . Insertion of mesh  06/16/2012    Procedure: INSERTION OF MESH;  Surgeon: [name], MD;  Location: [facility];  Service: General;  Laterality: N/A;     Current Outpatient Prescriptions  Medication Sig Dispense Refill  . aspirin EC 81 MG tablet Take 81 mg by mouth daily. STOPPED FOR PROCEDURE    .  Cholecalciferol (VITAMIN D) 2000 UNITS tablet Take 2,000 Units by mouth daily.    . metoprolol (LOPRESSOR) 100 MG tablet Take 100 mg by mouth daily.    . Multiple Vitamins-Minerals (PX SENIOR VITAMIN PO) Take 1 tablet by mouth daily.    Marland Kitchen NIFEdipine (ADALAT CC) 90 MG 24 hr tablet Take 90 mg by mouth every morning.     . NON FORMULARY CPAP VPAP AT NIGHT    . Omega-3 Fatty Acids (FISH OIL) 1000 MG CAPS Take 1,000 capsules by mouth daily.    . pravastatin (PRAVACHOL) 40 MG tablet Take 40 mg by mouth every evening.      No current facility-administered medications for this visit.    Allergies:   Review of patient's allergies indicates no known allergies.    Social History:  The patient  reports that she has never smoked. She has never used smokeless tobacco. She reports that she does not drink alcohol or use illicit drugs.   Family History:  The patient's family history includes Arthritis in her father; Diabetes in her mother; Hypertension in her father; Stroke in her father.    ROS:  Please see the history of present illness.   Otherwise, review of systems are  positive for none.   All other systems are reviewed and negative.    PHYSICAL EXAM: VS:  BP 110/70 mmHg  Pulse 57  Ht 5\' 5"  (1.651 m)  Wt 241 lb 3.2 oz (109.408 kg)  BMI 40.14 kg/m2  SpO2 96% , BMI Body mass index is 40.14 kg/(m^2). GEN: Well nourished, well developed, in no acute distress HEENT: normal Neck: no JVD, carotid bruits, or masses Cardiac: RRR; no murmurs, rubs, or gallops,no edema  Respiratory:  clear to auscultation bilaterally, normal work of breathing GI: soft, nontender, nondistended, + BS MS: no deformity or atrophy Skin: warm and dry, no rash Neuro:  Strength and sensation are intact Psych: euthymic mood, full affect   EKG:  EKG is not ordered today.    Recent Labs: No results found for requested labs within last 365 days.    Lipid Panel No results found for: CHOL, TRIG, HDL, CHOLHDL, VLDL,  LDLCALC, LDLDIRECT    Wt Readings from Last 3 Encounters:  04/02/15 241 lb 3.2 oz (109.408 kg)  10/16/14 261 lb 12.8 oz (118.752 kg)  04/17/14 255 lb 12.8 oz (116.03 kg)     ASSESSMENT AND PLAN:  1. OSA on CPAP and tolerating well. Her download today showed an AHI of 1.5/hr on 12cm H2o and 100% compliance in using more than 4 hours nightly. She will continue on current settings. 2. Obesity - I have encouraged her to continue her exercise.   3. HTN - controlled - continue metoprolol/Nifedipine    Current medicines are reviewed at length with the patient today.  The patient does not have concerns regarding medicines.  The following changes have been made:  no change  Labs/ tests ordered today: See above Assessment and Plan No orders of the defined types were placed in this encounter.     Disposition:   FU with me in 1 year  Signed, Quintella Reichert, MD  04/02/2015 9:56 AM    [facility] [address] [place], [place], [place]  [address] Phone: [phone]; Fax: [phone]

## 2015-04-02 ENCOUNTER — Encounter: Payer: Self-pay | Admitting: Cardiology

## 2015-04-02 ENCOUNTER — Ambulatory Visit (INDEPENDENT_AMBULATORY_CARE_PROVIDER_SITE_OTHER): Payer: Medicare Other | Admitting: Cardiology

## 2015-04-02 VITALS — BP 110/70 | HR 57 | Ht 65.0 in | Wt 241.2 lb

## 2015-04-02 DIAGNOSIS — I10 Essential (primary) hypertension: Secondary | ICD-10-CM

## 2015-04-02 DIAGNOSIS — G4733 Obstructive sleep apnea (adult) (pediatric): Secondary | ICD-10-CM

## 2015-04-02 NOTE — Patient Instructions (Signed)

## 2015-04-24 ENCOUNTER — Encounter: Payer: Self-pay | Admitting: Cardiology

## 2019-05-12 ENCOUNTER — Telehealth: Payer: Self-pay | Admitting: *Deleted

## 2019-05-12 NOTE — Telephone Encounter (Signed)
Virtual Visit Pre-Appointment Phone Call  "(Name), I am calling you today to discuss your upcoming appointment. We are currently trying to limit exposure to the virus that causes COVID-19 by seeing patients at home rather than in the office."  1. "What is the BEST phone number to call the day of the visit?" - include this in appointment notes  2. "Do you have or have access to (through a family member/friend) a smartphone with video capability that we can use for your visit?" a. If yes - list this number in appt notes as "cell" (if different from BEST phone #) and list the appointment type as a VIDEO visit in appointment notes b. If no - list the appointment type as a PHONE visit in appointment notes  3. Confirm consent - "In the setting of the current Covid19 crisis, you are scheduled for a (phone or video) visit with your provider on (date) at (time).  Just as we do with many in-office visits, in order for you to participate in this visit, we must obtain consent.  If you'd like, I can send this to your mychart (if signed up) or email for you to review.  Otherwise, I can obtain your verbal consent now.  All virtual visits are billed to your insurance company just like a normal visit would be.  By agreeing to a virtual visit, we'd like you to understand that the technology does not allow for your provider to perform an examination, and thus may limit your provider's ability to fully assess your condition. If your provider identifies any concerns that need to be evaluated in person, we will make arrangements to do so.  Finally, though the technology is pretty good, we cannot assure that it will always work on either your or our end, and in the setting of a video visit, we may have to convert it to a phone-only visit.  In either situation, we cannot ensure that we have a secure connection.  Are you willing to proceed?" STAFF: Did the patient verbally acknowledge consent to telehealth visit? Document  YES/NO here: YES   4. Advise patient to be prepared - "Two hours prior to your appointment, go ahead and check your blood pressure, pulse, oxygen saturation, and your weight (if you have the equipment to check those) and write them all down. When your visit starts, your provider will ask you for this information. If you have an Apple Watch or Kardia device, please plan to have heart rate information ready on the day of your appointment. Please have a pen and paper handy nearby the day of the visit as well."  5. Give patient instructions for MyChart download to smartphone OR [email or web address] as below if video visit (depending on what platform provider is using)  6. Inform patient they will receive a phone call 15 minutes prior to their appointment time (may be from unknown caller ID) so they should be prepared to answer    TELEPHONE CALL NOTE  Krystal Gutierrez has been deemed a candidate for a follow-up tele-health visit to limit community exposure during the Covid-19 pandemic. I spoke with the patient via phone to ensure availability of phone/video source, confirm preferred email & phone number, and discuss instructions and expectations.  I reminded Krystal Gutierrez to be prepared with any vital sign and/or heart rhythm information that could potentially be obtained via home monitoring, at the time of her visit. I reminded Krystal Gutierrez to expect a phone call prior to her visit.  Latrelle Dodrill, CMA 05/12/2019 3:01 PM   INSTRUCTIONS FOR DOWNLOADING THE MYCHART APP TO SMARTPHONE  - The patient must first make sure to have activated MyChart and know their login information - If Apple, go to [employer] and type in MyChart in the search bar and download the app. If Android, ask patient to go to [employer] and type in [place] in the search bar and download the app. The app is free but as with any other app downloads, their phone may require them to verify saved payment information or  Apple/Android password.  - The patient will need to then log into the app with their MyChart username and password, and select [facility] as their healthcare provider to link the account. When it is time for your visit, go to the MyChart app, find appointments, and click Begin Video Visit. Be sure to Select Allow for your device to access the Microphone and Camera for your visit. You will then be connected, and your provider will be with you shortly.  **If they have any issues connecting, or need assistance please contact MyChart service desk [phone])**  **If using a computer, in order to ensure the best quality for their visit they will need to use either of the following Internet Browsers: [employer] or Google Chrome**  IF USING DOXIMITY or DOXY.ME - The patient will receive a link just prior to their visit by text.     FULL LENGTH CONSENT FOR TELE-HEALTH VISIT   I hereby voluntarily request, consent and authorize [facility] and its employed or contracted physicians, physician assistants, nurse practitioners or other licensed health care professionals (the Practitioner), to provide me with telemedicine health care services (the "Services") as deemed necessary by the treating Practitioner. I acknowledge and consent to receive the Services by the Practitioner via telemedicine. I understand that the telemedicine visit will involve communicating with the Practitioner through live audiovisual communication technology and the disclosure of certain medical information by electronic transmission. I acknowledge that I have been given the opportunity to request an in-person assessment or other available alternative prior to the telemedicine visit and am voluntarily participating in the telemedicine visit.  I understand that I have the right to withhold or withdraw my consent to the use of telemedicine in the course of my care at any time, without affecting my right to future care  or treatment, and that the Practitioner or I may terminate the telemedicine visit at any time. I understand that I have the right to inspect all information obtained and/or recorded in the course of the telemedicine visit and may receive copies of available information for a reasonable fee.  I understand that some of the potential risks of receiving the Services via telemedicine include:  Marland Kitchen Delay or interruption in medical evaluation due to technological equipment failure or disruption; . Information transmitted may not be sufficient (e.g. poor resolution of images) to allow for appropriate medical decision making by the Practitioner; and/or  . In rare instances, security protocols could fail, causing a breach of personal health information.  Furthermore, I acknowledge that it is my responsibility to provide information about my medical history, conditions and care that is complete and accurate to the best of my ability. I acknowledge that Practitioner's advice, recommendations, and/or decision may be based on factors not within their control, such as incomplete or inaccurate data provided by me or distortions of diagnostic images or specimens that may result from electronic transmissions. I understand that  the practice of medicine is not an exact science and that Practitioner makes no warranties or guarantees regarding treatment outcomes. I acknowledge that I will receive a copy of this consent concurrently upon execution via email to the email address I last provided but may also request a printed copy by calling the office of [facility].    I understand that my insurance will be billed for this visit.   I have read or had this consent read to me. . I understand the contents of this consent, which adequately explains the benefits and risks of the Services being provided via telemedicine.  . I have been provided ample opportunity to ask questions regarding this consent and the Services and have had  my questions answered to my satisfaction. . I give my informed consent for the services to be provided through the use of telemedicine in my medical care  By participating in this telemedicine visit I agree to the above.

## 2019-06-27 ENCOUNTER — Telehealth: Payer: Self-pay | Admitting: *Deleted

## 2019-06-27 ENCOUNTER — Other Ambulatory Visit: Payer: Self-pay

## 2019-06-27 ENCOUNTER — Telehealth (INDEPENDENT_AMBULATORY_CARE_PROVIDER_SITE_OTHER): Payer: Medicare Other | Admitting: Cardiology

## 2019-06-27 VITALS — BP 138/72 | HR 80 | Wt 218.0 lb

## 2019-06-27 DIAGNOSIS — G4733 Obstructive sleep apnea (adult) (pediatric): Secondary | ICD-10-CM | POA: Diagnosis not present

## 2019-06-27 DIAGNOSIS — I10 Essential (primary) hypertension: Secondary | ICD-10-CM | POA: Diagnosis not present

## 2019-06-27 DIAGNOSIS — E669 Obesity, unspecified: Secondary | ICD-10-CM

## 2019-06-27 NOTE — Telephone Encounter (Signed)
Order placed to [facility] via community message and a request to send to the [place] location.

## 2019-06-27 NOTE — Patient Instructions (Signed)
Medication Instructions:  No changes today *If you need a refill on your cardiac medications before your next appointment, please call your pharmacy*  Lab Work: None ordered If you have labs (blood work) drawn today and your tests are completely normal, you will receive your results only by: Marland Kitchen MyChart Message (if you have MyChart) OR . A paper copy in the mail If you have any lab test that is abnormal or we need to change your treatment, we will call you to review the results.  Testing/Procedures: none  Follow-Up: You will be contacted by Dr. Theodosia Blender sleep assistant, Gershon Cull to arrange CPAP supplies and follow up plan.

## 2019-06-27 NOTE — Telephone Encounter (Signed)
-----   Message from Sueanne Margarita, MD sent at 06/27/2019 10:17 AM EST ----- Please order a new PAP device - Resmed CPAP at 12cm H2O with heated humidity and mask of choice. She will need repeat virtual visit 10 weeks after getting her new device.  Please send order to [facility] in [place].

## 2019-06-27 NOTE — Addendum Note (Signed)
Addended by: Fransico Him R on: 06/27/2019 10:22 AM   Modules accepted: Level of Service

## 2019-06-27 NOTE — Progress Notes (Addendum)
Virtual Visit via Telephone Note   This visit type was conducted due to national recommendations for restrictions regarding the COVID-19 Pandemic (e.g. social distancing) in an effort to limit this patient's exposure and mitigate transmission in our community.  Due to her co-morbid illnesses, this patient is at least at moderate risk for complications without adequate follow up.  This format is felt to be most appropriate for this patient at this time.  All issues noted in this document were discussed and addressed.  A limited physical exam was performed with this format.  Please refer to the patient's chart for her consent to telehealth for [facility].   Evaluation Performed:  Cardiology/Sleep Medicine Consult  This visit type was conducted due to national recommendations for restrictions regarding the COVID-19 Pandemic (e.g. social distancing).  This format is felt to be most appropriate for this patient at this time.  All issues noted in this document were discussed and addressed.  No physical exam was performed (except for noted visual exam findings with Video Visits).  Please refer to the patient's chart (MyChart message for video visits and phone note for telephone visits) for the patient's consent to telehealth for [facility].  Date:  06/27/2019   ID:  Krystal Gutierrez, DOB 01-Oct-1940, MRN [id number]  Patient Location:  Home  Provider location:   [place]  PCP: Delsa Grana, MD  Cardiologist:  Armanda Magic, MD Electrophysiologist:  None   Chief Complaint:  OSA  History of Present Illness:    Krystal Gutierrez is a 78 y.o. female who presents via audio/video conferencing for a telehealth visit today in referral from Delsa Grana, MD for evaluation of OSA.  Krystal Gutierrez is an 78 y.o. female with a history of OSA, obesity and HTN.  She has not seen a sleep provider in almost 5 years and is here to reestablish care.  She is doing well with her CPAP device and thinks that  she has gotten used to it.  She tolerates the mask and feels the pressure is adequate.  Since going on CPAP she feels rested in the am and has no significant daytime sleepiness.  She denies any significant mouth or nasal dryness or nasal congestion.  She does not think that he snores.  Her PAP device is old and she would like a new device. She was last on 12cm H2O.   The patient does not have symptoms concerning for COVID-19 infection (fever, chills, cough, or new shortness of breath).    Prior CV studies:   The following studies were reviewed today:  nond  Past Medical History:  Diagnosis Date  . Arthritis   . Diverticulosis   . Edema    bilateral legs  . Essential hypertension, benign   . Hip pain, left   . Hyperlipidemia   . Obesity (BMI 30-39.9)   . OSA (obstructive sleep apnea)    Severe OSA with AHI 54/hr now on VPAP SV. Download on 04/25/13 showed an AHI of 1.0/hr & 100% compliance in using more than 4 hours nightly. Tolerates mask and pressure well.   . Ventral hernia    periumbilical   Past Surgical History:  Procedure Laterality Date  . INSERTION OF MESH  06/16/2012   Procedure: INSERTION OF MESH;  Surgeon: [name], MD;  Location: [facility];  Service: General;  Laterality: N/A;  . 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     No outpatient medications have been marked as taking for the 06/27/19 encounter (Telemedicine) with Sueanne Margarita, MD.     Allergies:   Patient has no known allergies.   Social History   Tobacco Use  . Smoking status: Never Smoker  . Smokeless tobacco: Never Used   Substance Use Topics  . Alcohol use: No  . Drug use: No     Family Hx: The patient's family history includes Arthritis in her father; Diabetes in her mother; Hypertension in her father; Stroke in her father.  ROS:   Please see the history of present illness.     All other systems reviewed and are negative.   Labs/Other Tests and Data Reviewed:    Recent Labs: No results found for requested labs within last 8760 hours.   Recent Lipid Panel No results found for: CHOL, TRIG, HDL, CHOLHDL, LDLCALC, LDLDIRECT  Wt Readings from Last 3 Encounters:  06/27/19 218 lb (98.9 kg)  04/02/15 241 lb 3.2 oz (109.4 kg)  10/16/14 261 lb 12.8 oz (118.8 kg)     Objective:    Vital Signs:  BP 138/72   Pulse 80   Wt 218 lb (98.9 kg)   BMI 36.28 kg/m     ASSESSMENT & PLAN:    1.  OSA -  The patient is tolerating PAP therapy well without any problems. Her daughter will arrange to get her device download and drop off at our office for my review.The patient has been using and benefiting from PAP use and will continue to benefit from therapy. Her device is old and she would like a new PAP device which I will order through [facility] in [place].  I will order a Resmed CPAP at 12cm H2O with heated humidity and mask of choice.   2.  HTN -BP controlled on exam -continue Maxide 37.5/25mg  daily.   3.  Obesity -I have encouraged her to continue in her exercise program and cut back on carbs and portions.    COVID-19 Education: The signs and symptoms of COVID-19 were discussed with the patient and how to seek care for testing (follow up with PCP or arrange E-visit).  The importance of social distancing was discussed today.  Patient Risk:   After full review of this patient's clinical status, I feel that they are at least moderate risk at this time.  Time:   Today, I have spent 20 minutes directly with the patient on telemedicine discussing medical problems including OSA, HTN, obesity.  We also  reviewed the symptoms of COVID 19 and the ways to protect against contracting the virus with telehealth technology.   Medication Adjustments/Labs and Tests Ordered: Current medicines are reviewed at length with the patient today.  Concerns regarding medicines are outlined above.  Tests Ordered: No orders of the defined types were placed in this encounter.  Medication Changes: No orders of the defined types were placed in this encounter.   Disposition:  Follow up 10 weeks after getting new device  Signed, Fransico Him, MD  06/27/2019 10:00 AM    [place]

## 2019-06-30 ENCOUNTER — Telehealth: Payer: Self-pay | Admitting: *Deleted

## 2019-06-30 NOTE — Telephone Encounter (Signed)
-----   Message from Sueanne Margarita, MD sent at 06/30/2019 11:12 AM EST ----- Good AHI and compliance.  Continue current PAP settings.

## 2019-06-30 NOTE — Telephone Encounter (Signed)
Informed patient of compliance results and verbalized understanding was indicated. Patient is aware and agreeable to AHI being within range at 1.8. Patient is aware and agreeable to being in compliance with machine usage Patient is aware and agreeable to no change in current pressures.

## 2019-07-07 NOTE — Telephone Encounter (Signed)
Upon patient request DME selection is [facility]. Patient understands she will be contacted by [facility] to set up her cpap. Patient understands to call if [facility] does not contact her with new setup in a timely manner. Patient understands they will be called once confirmation has been received from [facility] that they have received their new machine to schedule 10 week follow up appointment.  [facility] notified of new cpap order  Please add to airview Patient was grateful for the call and thanked me.

## 2019-07-07 NOTE — Telephone Encounter (Signed)
Patient has a 10 week follow up appointment scheduled for 08/30/19. Patient understands she needs to keep this appointment for insurance compliance. Patient was grateful for the call and thanked me.

## 2019-08-29 NOTE — Progress Notes (Addendum)
Virtual Visit via Telephone Note   This visit type was conducted due to national recommendations for restrictions regarding the COVID-19 Pandemic (e.g. social distancing) in an effort to limit this patient's exposure and mitigate transmission in our community.  Due to her co-morbid illnesses, this patient is at least at moderate risk for complications without adequate follow up.  This format is felt to be most appropriate for this patient at this time.  All issues noted in this document were discussed and addressed.  A limited physical exam was performed with this format.  Please refer to the patient's chart for her consent to telehealth for [facility].   Evaluation Performed:  Follow-up visit  This visit type was conducted due to national recommendations for restrictions regarding the COVID-19 Pandemic (e.g. social distancing).  This format is felt to be most appropriate for this patient at this time.  All issues noted in this document were discussed and addressed.  No physical exam was performed (except for noted visual exam findings with Video Visits).  Please refer to the patient's chart (MyChart message for video visits and phone note for telephone visits) for the patient's consent to telehealth for [facility].  Date:  08/30/2019   ID:  Krystal Gutierrez, DOB [birth year], MRN [id number]  Patient Location:  Home  Provider location:   [facility]  PCP:  System, Provider Not In  Sleep Medicine:  Fransico Him, MD Electrophysiologist:  None   Chief Complaint:  OSA  History of Present Illness:    Krystal Gutierrez is a 79 y.o. female who presents via audio/video conferencing for a telehealth visit today.    Krystal Gutierrez is an 79 y.o. female with a history of OSA, obesity and HTN. She is doing well with her CPAP device and thinks that she has gotten used to it.  She tolerates the mask and feels the pressure is adequate.  Since going on CPAP she feels rested in the am and has no significant  daytime sleepiness.  She denies any significant mouth or nasal dryness or nasal congestion.  She does not think that he snores.    The patient does not have symptoms concerning for COVID-19 infection (fever, chills, cough, or new shortness of breath).   Prior CV studies:   The following studies were reviewed today:  PAP compliance download  Past Medical History:  Diagnosis Date  . Arthritis   . Diverticulosis   . Edema    bilateral legs  . Essential hypertension, benign   . Hip pain, left   . Hyperlipidemia   . Obesity (BMI 30-39.9)   . OSA (obstructive sleep apnea)    Severe OSA with AHI 54/hr now on VPAP SV. Download on 04/25/13 showed an AHI of 1.0/hr & 100% compliance in using more than 4 hours nightly. Tolerates mask and pressure well.   . Ventral hernia    periumbilical   Past Surgical History:  Procedure Laterality Date  . INSERTION OF MESH  06/16/2012   Procedure: INSERTION OF MESH;  Surgeon: Adin Hector, MD;  [facility]: [facility];  Service: General;  Laterality: N/A;  . 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     Current Meds  Medication Sig  . aspirin EC 81 MG tablet Take 81 mg by mouth daily. STOPPED FOR PROCEDURE  . Cholecalciferol (VITAMIN D) 2000 UNITS tablet Take 2,000 Units by mouth daily.  . furosemide (LASIX) 20 MG tablet Take 40 mg by mouth daily.  . Multiple Vitamins-Minerals (PX SENIOR VITAMIN PO) Take 1 tablet by mouth daily.  . NON FORMULARY CPAP VPAP AT NIGHT  . Omega-3 Fatty Acids (FISH OIL) 1000 MG CAPS Take 1,000 capsules by mouth daily.  . potassium chloride (MICRO-K) 10 MEQ CR  capsule Take 20 mEq by mouth daily.  . pravastatin (PRAVACHOL) 40 MG tablet Take 40 mg by mouth every evening.      Allergies:   Patient has no known allergies.   Social History   Tobacco Use  . Smoking status: Never Smoker  . Smokeless tobacco: Never Used  Substance Use Topics  . Alcohol use: No  . Drug use: No     Family Hx: The patient's family history includes Arthritis in her father; Diabetes in her mother; Hypertension in her father; Stroke in her father.  ROS:   Please see the history of present illness.     All other systems reviewed and are negative.   Labs/Other Tests and Data Reviewed:    Recent Labs: No results found for requested labs within last 8760 hours.   Recent Lipid Panel No results found for: CHOL, TRIG, HDL, CHOLHDL, LDLCALC, LDLDIRECT  Wt Readings from Last 3 Encounters:  08/30/19 210 lb (95.3 kg)  06/27/19 218 lb (98.9 kg)  04/02/15 241 lb 3.2 oz (109.4 kg)     Objective:    Vital Signs:  BP (!) 129/53   Pulse 86   Ht 5\' 5"  (1.651 m)   Wt 210 lb (95.3 kg)   BMI 34.95 kg/m    ASSESSMENT & PLAN:    1.  OSA - The patient is tolerating PAP therapy well without any problems. The PAP download was reviewed today and showed an AHI of 0.4/hr on 12 cm H2O with 100% compliance in using more than 4 hours nightly.  The patient has been using and benefiting from PAP use and will continue to benefit from therapy.   2.  HTN -continue diuretics  3.  Obesity -She is trying to watch the carbs in her diet and she has lost weight -she is walking in her house a lot and also outside when it is not too cold    COVID-19 Education: The signs and symptoms of COVID-19 were discussed with the patient and how to seek care for testing (follow up with PCP or arrange E-visit).  The importance of social distancing was discussed today.  Patient Risk:   After full review of this patient's clinical status, I feel that they are at least moderate risk at this  time.  Time:   Today, I have spent 15 minutes directly with the patient on telemedicine discussing medical problems including OSA, HTN, obesity.  We also reviewed the symptoms of COVID 19 and the ways to protect against contracting the virus with telehealth technology.  I spent an additional 5 minutes reviewing patient's chart including PAP compliance downloaed.  Medication Adjustments/Labs and Tests Ordered: Current medicines are reviewed at length with the patient today.  Concerns regarding medicines are outlined above.  Tests Ordered: No orders of the defined types were placed in this encounter.  Medication Changes: No orders of the  defined types were placed in this encounter.   Disposition:  Follow up in 1 year(s)  Signed, Armanda Magic, MD  08/30/2019 9:58 AM    [facility]

## 2019-08-30 ENCOUNTER — Encounter: Payer: Self-pay | Admitting: Cardiology

## 2019-08-30 ENCOUNTER — Other Ambulatory Visit: Payer: Self-pay

## 2019-08-30 ENCOUNTER — Telehealth (INDEPENDENT_AMBULATORY_CARE_PROVIDER_SITE_OTHER): Payer: Medicare PPO | Admitting: Cardiology

## 2019-08-30 VITALS — BP 129/53 | HR 86 | Ht 65.0 in | Wt 210.0 lb

## 2019-08-30 DIAGNOSIS — I10 Essential (primary) hypertension: Secondary | ICD-10-CM

## 2019-08-30 DIAGNOSIS — E669 Obesity, unspecified: Secondary | ICD-10-CM | POA: Diagnosis not present

## 2019-08-30 DIAGNOSIS — G4733 Obstructive sleep apnea (adult) (pediatric): Secondary | ICD-10-CM | POA: Diagnosis not present

## 2020-03-21 ENCOUNTER — Telehealth: Payer: Self-pay

## 2020-03-21 NOTE — Telephone Encounter (Signed)
Left message for patient to call back in regards to upcoming appointment with Dr. Mayford Knife.

## 2020-03-22 NOTE — Telephone Encounter (Signed)
Appointment cancelled patient is agreeable to treatment.

## 2020-03-27 ENCOUNTER — Telehealth: Payer: Medicare PPO | Admitting: Cardiology

## 2020-04-09 NOTE — Progress Notes (Signed)
Virtual Visit via Telephone Note   This visit type was conducted due to national recommendations for restrictions regarding the COVID-19 Pandemic (e.g. social distancing) in an effort to limit this patient's exposure and mitigate transmission in our community.  Due to her co-morbid illnesses, this patient is at least at moderate risk for complications without adequate follow up.  This format is felt to be most appropriate for this patient at this time.  All issues noted in this document were discussed and addressed.  A limited physical exam was performed with this format.  Please refer to the patient's chart for her consent to telehealth for [facility].   Evaluation Performed:  Follow-up visit  This visit type was conducted due to national recommendations for restrictions regarding the COVID-19 Pandemic (e.g. social distancing).  This format is felt to be most appropriate for this patient at this time.  All issues noted in this document were discussed and addressed.  No physical exam was performed (except for noted visual exam findings with Video Visits).  Please refer to the patient's chart (MyChart message for video visits and phone note for telephone visits) for the patient's consent to telehealth for [facility].  Date:  04/10/2020   ID:  Krystal Gutierrez, DOB [birth year], MRN [id number]  Patient Location:  Home  Provider location:   [facility]  PCP:  System, Provider Not In  Sleep Medicine:  Armanda Magic, MD Electrophysiologist:  None   Chief Complaint:  OSA  History of Present Illness:    Krystal Gutierrez is a 79 y.o. female who presents via audio/video conferencing for a telehealth visit today.    Krystal Gutierrez is an 79 y.o. female with a history of OSA, obesity and HTN. SHe is doing well with her CPAP device and thinks that she has gotten used to it.  She tolerates the nasal mask and feels the pressure is adequate.  Since going on CPAP she feels rested in the am and has no  significant daytime sleepiness.  She denies any significant mouth or nasal dryness or nasal congestion.  She does not think that he snores.    The patient does not have symptoms concerning for COVID-19 infection (fever, chills, cough, or new shortness of breath).   Prior CV studies:   The following studies were reviewed today:  PAP compliance download  Past Medical History:  Diagnosis Date  . Arthritis   . Diverticulosis   . Edema    bilateral legs  . Essential hypertension, benign   . Hip pain, left   . Hyperlipidemia   . Obesity (BMI 30-39.9)   . OSA (obstructive sleep apnea)    Severe OSA with AHI 54/hr now on VPAP SV. Download on 04/25/13 showed an AHI of 1.0/hr & 100% compliance in using more than 4 hours nightly. Tolerates mask and pressure well.   . Ventral hernia    periumbilical   Past Surgical History:  Procedure Laterality Date  . INSERTION OF MESH  06/16/2012   Procedure: INSERTION OF MESH;  Surgeon: [name], MD;  [facility]: [facility];  Service: General;  Laterality: N/A;  . TOTAL HIP ARTHROPLASTY  11/10/2011   Procedure: TOTAL HIP ARTHROPLASTY;  Surgeon: Shelda Pal, MD;  Location: [facility];  Service: Orthopedics;  Laterality: Left;  Marland Kitchen VENTRAL HERNIA REPAIR  06/16/2012   Procedure: LAPAROSCOPIC VENTRAL HERNIA;  Surgeon: [name], MD;  Location: [facility];  Service: General;  Laterality: N/A;  Laparosocopic Ventral Wall incarcerated  Hernia with  Mesh, insertion of ON- Q pain pump.     Current Meds  Medication Sig  . allopurinol (ZYLOPRIM) 300 MG tablet Take 300 mg by mouth daily.  Marland Kitchen aspirin EC 81 MG tablet Take 81 mg by mouth daily.   . cetirizine (ZYRTEC) 10 MG tablet Take 10 mg by mouth as needed for allergies.  . Cholecalciferol (VITAMIN D) 2000 UNITS tablet Take 2,000 Units by mouth daily.  . Colchicine (MITIGARE) 0.6 MG CAPS Take 0.6 mg by mouth in the morning and at bedtime.  . furosemide (LASIX) 20 MG tablet Take 40 mg by mouth daily.  . Multiple  Vitamins-Minerals (PX SENIOR VITAMIN PO) Take 1 tablet by mouth daily.  . NON FORMULARY CPAP VPAP AT NIGHT  . Omega-3 Fatty Acids (FISH OIL) 1000 MG CAPS Take 1,200 mg by mouth daily.   . potassium chloride (MICRO-K) 10 MEQ CR capsule Take 20 mEq by mouth daily.  . vitamin B-12 (CYANOCOBALAMIN) 100 MCG tablet Take 100 mcg by mouth daily.     Allergies:   Patient has no known allergies.   Social History   Tobacco Use  . Smoking status: Never Smoker  . Smokeless tobacco: Never Used  Substance Use Topics  . Alcohol use: No  . Drug use: No     Family Hx: The patient's family history includes Arthritis in her father; Diabetes in her mother; Hypertension in her father; Stroke in her father.  ROS:   Please see the history of present illness.     All other systems reviewed and are negative.   Labs/Other Tests and Data Reviewed:    Recent Labs: No results found for requested labs within last 8760 hours.   Recent Lipid Panel No results found for: CHOL, TRIG, HDL, CHOLHDL, LDLCALC, LDLDIRECT  Wt Readings from Last 3 Encounters:  04/10/20 204 lb (92.5 kg)  08/30/19 210 lb (95.3 kg)  06/27/19 218 lb (98.9 kg)     Objective:    Vital Signs:  BP 125/75   Pulse 63   Ht 5\' 5"  (1.651 m)   Wt 204 lb (92.5 kg)   BMI 33.95 kg/m    ASSESSMENT & PLAN:    1.  OSA -  The patient is tolerating PAP therapy well without any problems. The PAP download was reviewed today and showed an AHI of 4.4/hr on 12 cm H2O with 100% compliance in using more than 4 hours nightly.  The patient has been using and benefiting from PAP use and will continue to benefit from therapy.   2.  HTN -continue diuretics  3.  Obesity -I have encouraged her to get into a routine exercise program and cut back on carbs and portions.   COVID-19 Education: She has had both COVID 19 vaccines  Patient Risk:   After full review of this patient's clinical status, I feel that they are at least moderate risk at this  time.  Time:   Today, I have spent 15 minutes on telemedicine discussing medical problems including OSA, HTN, obesity and reviewing patient's chart including PAP compliance downloaed.  Medication Adjustments/Labs and Tests Ordered: Current medicines are reviewed at length with the patient today.  Concerns regarding medicines are outlined above.  Tests Ordered: No orders of the defined types were placed in this encounter.  Medication Changes: No orders of the defined types were placed in this encounter.   Disposition:  Follow up in 1 year(s)  Signed, , MD  04/10/2020 9:27 AM    [facility]

## 2020-04-10 ENCOUNTER — Encounter: Payer: Self-pay | Admitting: Cardiology

## 2020-04-10 ENCOUNTER — Telehealth (INDEPENDENT_AMBULATORY_CARE_PROVIDER_SITE_OTHER): Payer: Medicare PPO | Admitting: Cardiology

## 2020-04-10 ENCOUNTER — Other Ambulatory Visit: Payer: Self-pay

## 2020-04-10 VITALS — BP 125/75 | HR 63 | Ht 65.0 in | Wt 204.0 lb

## 2020-04-10 DIAGNOSIS — G4733 Obstructive sleep apnea (adult) (pediatric): Secondary | ICD-10-CM | POA: Diagnosis not present

## 2020-04-10 DIAGNOSIS — I10 Essential (primary) hypertension: Secondary | ICD-10-CM | POA: Diagnosis not present

## 2020-04-10 DIAGNOSIS — E669 Obesity, unspecified: Secondary | ICD-10-CM

## 2020-04-10 NOTE — Patient Instructions (Signed)
Medication Instructions:  Your physician recommends that you continue on your current medications as directed. Please refer to the Current Medication list given to you today.  *If you need a refill on your cardiac medications before your next appointment, please call your pharmacy*  Follow-Up: At [facility], you and your health needs are our priority.  As part of our continuing mission to provide you with exceptional heart care, we have created designated Provider Care Teams.  These Care Teams include your primary Cardiologist (physician) and Advanced Practice Providers (APPs -  Physician Assistants and Nurse Practitioners) who all work together to provide you with the care you need, when you need it.  We recommend signing up for the patient portal called "MyChart".  Sign up information is provided on this After Visit Summary.  MyChart is used to connect with patients for Virtual Visits (Telemedicine).  Patients are able to view lab/test results, encounter notes, upcoming appointments, etc.  Non-urgent messages can be sent to your provider as well.   To learn more about what you can do with MyChart, go to [email or web address].    Your next appointment:   1 year(s)  The format for your next appointment:   Virtual Visit   Provider:   Traci Turner, MD
